# Patient Record
Sex: Female | Born: 1955 | Race: White | Hispanic: No | Marital: Single | Smoking: Never smoker
Health system: Southern US, Community
[De-identification: ages and names within clinical notes are randomized; demographics above are authoritative.]

## PROBLEM LIST (undated history)

## (undated) DIAGNOSIS — E039 Hypothyroidism, unspecified: Secondary | ICD-10-CM

## (undated) DIAGNOSIS — T7840XA Allergy, unspecified, initial encounter: Secondary | ICD-10-CM

## (undated) DIAGNOSIS — J45909 Unspecified asthma, uncomplicated: Secondary | ICD-10-CM

## (undated) DIAGNOSIS — E079 Disorder of thyroid, unspecified: Secondary | ICD-10-CM

## (undated) DIAGNOSIS — F32A Depression, unspecified: Secondary | ICD-10-CM

## (undated) DIAGNOSIS — J342 Deviated nasal septum: Secondary | ICD-10-CM

## (undated) DIAGNOSIS — I10 Essential (primary) hypertension: Secondary | ICD-10-CM

## (undated) DIAGNOSIS — M1990 Unspecified osteoarthritis, unspecified site: Secondary | ICD-10-CM

## (undated) DIAGNOSIS — F329 Major depressive disorder, single episode, unspecified: Secondary | ICD-10-CM

## (undated) HISTORY — PX: TONSILLECTOMY: SUR1361

## (undated) HISTORY — DX: Disorder of thyroid, unspecified: E07.9

## (undated) HISTORY — DX: Major depressive disorder, single episode, unspecified: F32.9

## (undated) HISTORY — DX: Depression, unspecified: F32.A

## (undated) HISTORY — DX: Allergy, unspecified, initial encounter: T78.40XA

## (undated) HISTORY — DX: Unspecified asthma, uncomplicated: J45.909

## (undated) HISTORY — PX: WISDOM TOOTH EXTRACTION: SHX21

## (undated) HISTORY — DX: Unspecified osteoarthritis, unspecified site: M19.90

---

## 1993-08-01 HISTORY — PX: OVARIAN CYST SURGERY: SHX726

## 2000-05-31 ENCOUNTER — Encounter: Payer: Self-pay | Admitting: Specialist

## 2000-05-31 ENCOUNTER — Encounter: Admission: RE | Admit: 2000-05-31 | Discharge: 2000-05-31 | Payer: Self-pay | Admitting: Specialist

## 2016-02-09 DIAGNOSIS — E065 Other chronic thyroiditis: Secondary | ICD-10-CM | POA: Diagnosis not present

## 2016-02-09 DIAGNOSIS — E509 Vitamin A deficiency, unspecified: Secondary | ICD-10-CM | POA: Diagnosis not present

## 2016-02-09 DIAGNOSIS — E559 Vitamin D deficiency, unspecified: Secondary | ICD-10-CM | POA: Diagnosis not present

## 2016-02-09 DIAGNOSIS — E039 Hypothyroidism, unspecified: Secondary | ICD-10-CM | POA: Diagnosis not present

## 2016-07-23 ENCOUNTER — Ambulatory Visit (INDEPENDENT_AMBULATORY_CARE_PROVIDER_SITE_OTHER): Payer: BLUE CROSS/BLUE SHIELD

## 2016-07-23 ENCOUNTER — Ambulatory Visit (INDEPENDENT_AMBULATORY_CARE_PROVIDER_SITE_OTHER): Payer: BLUE CROSS/BLUE SHIELD | Admitting: Urgent Care

## 2016-07-23 VITALS — BP 142/92 | HR 71 | Temp 98.4°F | Resp 17 | Ht 63.0 in

## 2016-07-23 DIAGNOSIS — J069 Acute upper respiratory infection, unspecified: Secondary | ICD-10-CM

## 2016-07-23 DIAGNOSIS — R05 Cough: Secondary | ICD-10-CM | POA: Diagnosis not present

## 2016-07-23 DIAGNOSIS — B9789 Other viral agents as the cause of diseases classified elsewhere: Secondary | ICD-10-CM

## 2016-07-23 DIAGNOSIS — J029 Acute pharyngitis, unspecified: Secondary | ICD-10-CM | POA: Diagnosis not present

## 2016-07-23 LAB — POCT CBC
GRANULOCYTE PERCENT: 58.7 % (ref 37–80)
HCT, POC: 44.8 % (ref 37.7–47.9)
Hemoglobin: 15.9 g/dL (ref 12.2–16.2)
Lymph, poc: 2.9 (ref 0.6–3.4)
MCH: 32.3 pg — AB (ref 27–31.2)
MCHC: 35.5 g/dL — AB (ref 31.8–35.4)
MCV: 91 fL (ref 80–97)
MID (CBC): 0.7 (ref 0–0.9)
MPV: 6.9 fL (ref 0–99.8)
POC Granulocyte: 5 (ref 2–6.9)
POC LYMPH PERCENT: 33.5 %L (ref 10–50)
POC MID %: 7.8 % (ref 0–12)
Platelet Count, POC: 289 10*3/uL (ref 142–424)
RBC: 4.93 M/uL (ref 4.04–5.48)
RDW, POC: 13.1 %
WBC: 8.6 10*3/uL (ref 4.6–10.2)

## 2016-07-23 LAB — POCT RAPID STREP A (OFFICE): Rapid Strep A Screen: NEGATIVE

## 2016-07-23 MED ORDER — PSEUDOEPHEDRINE HCL 60 MG PO TABS: 60.0000 mg | ORAL_TABLET | Freq: Three times a day (TID) | ORAL | 0 refills | Status: DC | PRN

## 2016-07-23 MED ORDER — ACETAMINOPHEN-CODEINE 120-12 MG/5ML PO SOLN: 10.0000 mL | Freq: Four times a day (QID) | ORAL | 0 refills | Status: DC | PRN

## 2016-07-23 NOTE — Patient Instructions (Addendum)
Upper Respiratory Infection, Adult Most upper respiratory infections (URIs) are a viral infection of the air passages leading to the lungs. A URI affects the nose, throat, and upper air passages. The most common type of URI is nasopharyngitis and is typically referred to as "the common cold." URIs run their course and usually go away on their own. Most of the time, a URI does not require medical attention, but sometimes a bacterial infection in the upper airways can follow a viral infection. This is called a secondary infection. Sinus and middle ear infections are common types of secondary upper respiratory infections. Bacterial pneumonia can also complicate a URI. A URI can worsen asthma and chronic obstructive pulmonary disease (COPD). Sometimes, these complications can require emergency medical care and may be life threatening. What are the causes? Almost all URIs are caused by viruses. A virus is a type of germ and can spread from one person to another. What increases the risk? You may be at risk for a URI if:  You smoke.  You have chronic heart or lung disease.  You have a weakened defense (immune) system.  You are very young or very old.  You have nasal allergies or asthma.  You work in crowded or poorly ventilated areas.  You work in health care facilities or schools.  What are the signs or symptoms? Symptoms typically develop 2-3 days after you come in contact with a cold virus. Most viral URIs last 7-10 days. However, viral URIs from the influenza virus (flu virus) can last 14-18 days and are typically more severe. Symptoms may include:  Runny or stuffy (congested) nose.  Sneezing.  Cough.  Sore throat.  Headache.  Fatigue.  Fever.  Loss of appetite.  Pain in your forehead, behind your eyes, and over your cheekbones (sinus pain).  Muscle aches.  How is this diagnosed? Your health care provider may diagnose a URI by:  Physical exam.  Tests to check that your  symptoms are not due to another condition such as: ? Strep throat. ? Sinusitis. ? Pneumonia. ? Asthma.  How is this treated? A URI goes away on its own with time. It cannot be cured with medicines, but medicines may be prescribed or recommended to relieve symptoms. Medicines may help:  Reduce your fever.  Reduce your cough.  Relieve nasal congestion.  Follow these instructions at home:  Take medicines only as directed by your health care provider.  Gargle warm saltwater or take cough drops to comfort your throat as directed by your health care provider.  Use a warm mist humidifier or inhale steam from a shower to increase air moisture. This may make it easier to breathe.  Drink enough fluid to keep your urine clear or pale yellow.  Eat soups and other clear broths and maintain good nutrition.  Rest as needed.  Return to work when your temperature has returned to normal or as your health care provider advises. You may need to stay home longer to avoid infecting others. You can also use a face mask and careful hand washing to prevent spread of the virus.  Increase the usage of your inhaler if you have asthma.  Do not use any tobacco products, including cigarettes, chewing tobacco, or electronic cigarettes. If you need help quitting, ask your health care provider. How is this prevented? The best way to protect yourself from getting a cold is to practice good hygiene.  Avoid oral or hand contact with people with cold symptoms.  Wash your   hands often if contact occurs.  There is no clear evidence that vitamin C, vitamin E, echinacea, or exercise reduces the chance of developing a cold. However, it is always recommended to get plenty of rest, exercise, and practice good nutrition. Contact a health care provider if:  You are getting worse rather than better.  Your symptoms are not controlled by medicine.  You have chills.  You have worsening shortness of breath.  You have  brown or red mucus.  You have yellow or brown nasal discharge.  You have pain in your face, especially when you bend forward.  You have a fever.  You have swollen neck glands.  You have pain while swallowing.  You have white areas in the back of your throat. Get help right away if:  You have severe or persistent: ? Headache. ? Ear pain. ? Sinus pain. ? Chest pain.  You have chronic lung disease and any of the following: ? Wheezing. ? Prolonged cough. ? Coughing up blood. ? A change in your usual mucus.  You have a stiff neck.  You have changes in your: ? Vision. ? Hearing. ? Thinking. ? Mood. This information is not intended to replace advice given to you by your health care provider. Make sure you discuss any questions you have with your health care provider. Document Released: 01/11/2001 Document Revised: 03/20/2016 Document Reviewed: 10/23/2013 Elsevier Interactive Patient Education  2017 Elsevier Inc.     IF you received an x-ray today, you will receive an invoice from [facility]. Please contact [facility] at [phone] with questions or concerns regarding your invoice.   IF you received labwork today, you will receive an invoice from [facility]. Please contact [facility] at [phone] with questions or concerns regarding your invoice.   Our billing staff will not be able to assist you with questions regarding bills from these companies.  You will be contacted with the lab results as soon as they are available. The fastest way to get your results is to activate your My Chart account. Instructions are located on the last page of this paperwork. If you have not heard from us regarding the results in 2 weeks, please contact this office.     

## 2016-07-23 NOTE — Progress Notes (Addendum)
MRN: [id number] DOB: 01-25-1956  Subjective:   Brenda Christian is a 60 y.o. female presenting for chief complaint of Sore Throat ("white specks on tonsil". PT REFUSED WEIGHT AT TRIAGE)  Reports 2 day history of sore throat, white spots on her tonsils. Also has sinus headache, sinus pressure, lymph node pain, productive cough (worse in the morning). She had cold like symptoms in mid-November for 2 weeks. Has tried multiple otc cough medications, decongestants. Denies fever, sinus pain, ear pain, chest pain, shob, n/v, abdominal pain, rashes. Denies history of asthma. But does get wheezing around cats. Admits environmental allergies. Denies smoking cigarettes.  Brenda Christian has a current medication list which includes the following prescription(s): cholecalciferol, glucosamine sulfate, ibuprofen, iodine-vitamin a, levothyroxine, liothyronine, losartan, fish oil, and vitamin a. Also is allergic to penicillins.  Brenda Christian has pmh of hypothyroidism. Denies past surgical history.  Denies family history of lung disease, lung cancer.  Objective:   Vitals: BP (!) 142/92 (BP Location: Left Arm, Patient Position: Sitting, Cuff Size: Large)   Pulse 71   Temp 98.4 F (36.9 C) (Oral)   Resp 17   Ht 5\' 3"  (1.6 m)   SpO2 98%   Physical Exam  Constitutional: She is oriented to person, place, and time. She appears well-developed and well-nourished.  HENT:  TM's intact bilaterally, no effusions or erythema. Nasal turbinates pink and dry, nasal passages minimally patent. No sinus tenderness. Oropharynx with mild-moderate post-nasal drainage, mucous membranes moist, dentition in good repair.  Eyes: Right eye exhibits no discharge. Left eye exhibits no discharge. No scleral icterus.  Neck: Normal range of motion. Neck supple.  Cardiovascular: Normal rate, regular rhythm and intact distal pulses.  Exam reveals no gallop and no friction rub.   No murmur heard. Pulmonary/Chest: No respiratory distress. She  has no wheezes. She has no rales.  Lymphadenopathy:    She has cervical adenopathy (R>L).  Neurological: She is alert and oriented to person, place, and time.  Skin: Skin is warm and dry.   Results for orders placed or performed in visit on 07/23/16 (from the past 24 hour(s))  POCT CBC     Status: Abnormal   Collection Time: 07/23/16 11:17 AM  Result Value Ref Range   WBC 8.6 4.6 - 10.2 K/uL   Lymph, poc 2.9 0.6 - 3.4   POC LYMPH PERCENT 33.5 10 - 50 %L   MID (cbc) 0.7 0 - 0.9   POC MID % 7.8 0 - 12 %M   POC Granulocyte 5.0 2 - 6.9   Granulocyte percent 58.7 37 - 80 %G   RBC 4.93 4.04 - 5.48 M/uL   Hemoglobin 15.9 12.2 - 16.2 g/dL   HCT, POC 40.944.8 81.137.7 - 47.9 %   MCV 91.0 80 - 97 fL   MCH, POC 32.3 (A) 27 - 31.2 pg   MCHC 35.5 (A) 31.8 - 35.4 g/dL   RDW, POC 91.413.1 %   Platelet Count, POC 289 142 - 424 K/uL   MPV 6.9 0 - 99.8 fL  POCT rapid strep A     Status: Normal   Collection Time: 07/23/16 11:44 AM  Result Value Ref Range   Rapid Strep A Screen Negative Negative   Dg Chest 2 View  Result Date: 07/23/2016 CLINICAL DATA:  Cough since November. EXAM: CHEST  2 VIEW COMPARISON:  None. FINDINGS: Lungs are adequately inflated without consolidation or effusion. Cardiomediastinal silhouette is within normal. There mild degenerate changes of the spine. IMPRESSION: No active cardiopulmonary disease.  Electronically Signed   By: Elberta Fortisaniel  Boyle M.D.   On: 07/23/2016 11:42   Assessment and Plan :   1. Viral URI with cough 2. Sore throat - Likely viral in nature, advised supportive care. - If no improvement or symptoms do not resolve return to clinic in 3 days.  Wallis BambergMario Kalden Wanke, PA-C [facility] [phone] 07/23/2016 10:34 AM

## 2016-07-26 ENCOUNTER — Encounter: Payer: Self-pay | Admitting: Urgent Care

## 2016-07-28 LAB — CULTURE, GROUP A STREP: Strep A Culture: NEGATIVE

## 2017-04-14 ENCOUNTER — Ambulatory Visit: Payer: BLUE CROSS/BLUE SHIELD | Admitting: Family Medicine

## 2017-04-17 NOTE — Progress Notes (Signed)
Subjective:    Patient ID: Brenda Christian, female    DOB: 03-Jun-1956, 61 y.o.   MRN: [id number]  04/18/2017  Establish Care; Hypothyroidism; and Osteoarthritis   HPI This 61 y.o. female presents to establish care.   Last physical:  01/2016 Pap smear:  Not sexually active; several years ago; before 61 years old. Mammogram:  Never; REFUSES Colonoscopy:  Never; REFUSES. Bone density:  never Eye exam:  4 years; +glasses; +readers Dental exam:  Several years  [facility]/PCP/Integrative Medicine; recommended establishing with PC.  Hypothyroidism: treating thyroid; integrative medicine.  +antibodies.  Doing very well right now.  Obesity: stable.   B knees osteoarthritis: Flexogenic; tai chi has really helped with balance a lot. Not a swimmer, so recommended Tai Chi.  Loves physical therapy.  Seated stepper which was great.    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   White coat syndrome: ran the average of BPs. Taking Losartan '25mg'$  bid.  Panic attacks: ginseng green tea instead of crystal light.    REFUSING MAMMOGRAPHY AND COLONOSCOPY.  Does not really want to do colonoscopy.  Cost is a huge issue.   Has a very high deductible.    BP Readings from Last 3 Encounters:  04/18/17 (!) 149/83  07/23/16 (!) 142/92   Wt Readings from Last 3 Encounters:  No data found for Wt    There is no immunization history on file for this patient.  Review of Systems  Constitutional: Negative for activity change, appetite change, chills, diaphoresis, fatigue, fever and unexpected weight change.  HENT: Negative for congestion, dental problem, drooling, ear discharge, ear pain, facial swelling, hearing loss, mouth sores, nosebleeds, postnasal drip, rhinorrhea, sinus pressure, sneezing, sore throat, tinnitus, trouble swallowing and voice change.   Eyes: Negative for photophobia, pain, discharge, redness, itching and visual disturbance.  Respiratory:  Negative for apnea, cough, choking, chest tightness, shortness of breath, wheezing and stridor.   Cardiovascular: Negative for chest pain, palpitations and leg swelling.  Gastrointestinal: Negative for abdominal distention, abdominal pain, anal bleeding, blood in stool, constipation, diarrhea, nausea, rectal pain and vomiting.  Endocrine: Negative for cold intolerance, heat intolerance, polydipsia, polyphagia and polyuria.  Genitourinary: Negative for decreased urine volume, difficulty urinating, dyspareunia, dysuria, enuresis, flank pain, frequency, genital sores, hematuria, menstrual problem, pelvic pain, urgency, vaginal bleeding, vaginal discharge and vaginal pain.       Nocturia x 3.  urianry leakage intermittently.  Musculoskeletal: Positive for arthralgias. Negative for back pain, gait problem, joint swelling, myalgias, neck pain and neck stiffness.  Skin: Negative for color change, pallor, rash and wound.  Allergic/Immunologic: Negative for environmental allergies, food allergies and immunocompromised state.  Neurological: Negative for dizziness, tremors, seizures, syncope, facial asymmetry, speech difficulty, weakness, light-headedness, numbness and headaches.  Hematological: Negative for adenopathy. Does not bruise/bleed easily.  Psychiatric/Behavioral: Negative for agitation, behavioral problems, confusion, decreased concentration, dysphoric mood, hallucinations, self-injury, sleep disturbance and suicidal ideas. The patient is nervous/anxious. The patient is not hyperactive.        Bedtime 1100; wakes up 7-8 hours later.    Past Medical History:  Diagnosis Date  . Allergy    cats  . Arthritis    knees  . Asthma   . Depression   . Thyroid disease    Past Surgical History:  Procedure Laterality Date  . OVARIAN CYST SURGERY    . TONSILLECTOMY    . WISDOM TOOTH EXTRACTION     Allergies  Allergen Reactions  . Eggs Or Egg-Derived Products   .  Penicillins    Current  Outpatient Prescriptions  Medication Sig Dispense Refill  . cholecalciferol (VITAMIN D) 400 units TABS tablet Take 10,000 Units by mouth daily.     . Glucosamine Sulfate 1000 MG CAPS Take by mouth.    . Ibuprofen 200 MG CAPS Take 200 mg by mouth at bedtime as needed for pain.     Nani Ravens A PO Take by mouth.    . levothyroxine (SYNTHROID, LEVOTHROID) 100 MCG tablet Take 1 tablet (100 mcg total) by mouth daily before breakfast. 90 tablet 3  . liothyronine (CYTOMEL) 25 MCG tablet Take 1 tablet (25 mcg total) by mouth daily. 90 tablet 3  . losartan (COZAAR) 25 MG tablet Take 1 tablet (25 mg total) by mouth 2 (two) times daily. 180 tablet 1  . Omega-3 Fatty Acids (FISH OIL) 1000 MG CAPS Take 1 capsule by mouth 2 (two) times daily.      No current facility-administered medications for this visit.    Social History   Social History  . Marital status: Single    Spouse name: N/A  . Number of children: N/A  . Years of education: N/A   Occupational History  . Not on file.   Social History Main Topics  . Smoking status: Never Smoker  . Smokeless tobacco: Never Used  . Alcohol use Yes  . Drug use: No  . Sexual activity: No   Other Topics Concern  . 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       Tobacco: none      Alcohol:  Rarely      Drugs; none      Exercise:  Tai Chi two days per week on Mondays and Wednesdays   Family History  Problem Relation Age of Onset  . Heart disease Mother   . Diabetes Mother   . Stroke Mother 75       CVA x 2  . Hyperlipidemia Father   . Stroke Maternal Grandmother   . Mental retardation Maternal Grandmother   . Heart disease Maternal Grandfather   . Cancer Paternal Grandmother   . Cancer Paternal Grandfather   . Heart disease Paternal Grandfather        Objective:    BP (!) 149/83    Pulse 81   Temp 98 F (36.7 C) (Oral)   Resp 16   Ht '5\' 2"'$  (1.575 m)   SpO2 96%  Physical Exam  Constitutional: She is oriented to person, place, and time. She appears well-developed and well-nourished. No distress.  HENT:  Head: Normocephalic and atraumatic.  Right Ear: External ear normal.  Left Ear: External ear normal.  Nose: Nose normal.  Mouth/Throat: Oropharynx is clear and moist.  Eyes: Pupils are equal, round, and reactive to light. Conjunctivae and EOM are normal.  Neck: Normal range of motion. Neck supple. Carotid bruit is not present. No thyromegaly present.  Cardiovascular: Normal rate, regular rhythm, normal heart sounds and intact distal pulses.  Exam reveals no gallop and no friction rub.   No murmur heard. Pulmonary/Chest: Effort normal and breath sounds normal. She has no wheezes. She has no rales.  Abdominal: Soft. Bowel sounds are normal. She exhibits no distension and no mass. There is no tenderness. There is no rebound and  no guarding.  Lymphadenopathy:    She has no cervical adenopathy.  Neurological: She is alert and oriented to person, place, and time. No cranial nerve deficit.  Skin: Skin is warm and dry. No rash noted. She is not diaphoretic. No erythema. No pallor.  Psychiatric: She has a normal mood and affect. Her behavior is normal.    No results found. Depression screen [facility] [address] 2/9 04/18/2017 07/23/2016  Decreased Interest 0 0  Down, Depressed, Hopeless 0 0  PHQ - 2 Score 0 0   Fall Risk  04/18/2017 07/23/2016  Falls in the past year? No No        Assessment & Plan:   1. Routine physical examination   2. Essential hypertension   3. Hashimoto's thyroiditis   4. Vitamin D deficiency   5. Colon cancer screening   6. Screening, lipid   7. Screening for diabetes mellitus   8. Primary osteoarthritis of both knees   9. Class 3 severe obesity due to excess calories without serious comorbidity with body mass index (BMI) of 40.0 to 44.9 in adult [facility])     -anticipatory guidance provided --- exercise, weight loss, safe driving practices, aspirin '81mg'$  daily. -obtain age appropriate screening labs and labs for chronic disease management. -stable chronic medical conditions; obtain labs; refills provided.  -pt refuses colonoscopy due to expense yet agreeable to hemosure completion; kit provided. -pt refuses pap smear and mammogram at this time; guidelines reviewed with patient. -pt refused immunizations yet current guidelines reviewed in detail.    Orders Placed This Encounter  Procedures  . CBC with Differential/Platelet  . Comprehensive metabolic panel    Order Specific Question:   Has the patient fasted?    Answer:   Yes  . Hemoglobin A1c  . Lipid panel    Order Specific Question:   Has the patient fasted?    Answer:   Yes  . T4, free  . TSH  . VITAMIN D 25 Hydroxy (Vit-D Deficiency, Fractures)  . IFOBT POC (occult bld, rslt in office)    Standing Status:   Future    Standing Expiration Date:   04/18/2018  . POCT urinalysis dipstick   Meds ordered this encounter  Medications  . levothyroxine (SYNTHROID, LEVOTHROID) 100 MCG tablet    Sig: Take 1 tablet (100 mcg total) by mouth daily before breakfast.    Dispense:  90 tablet    Refill:  3  . liothyronine (CYTOMEL) 25 MCG tablet    Sig: Take 1 tablet (25 mcg total) by mouth daily.    Dispense:  90 tablet    Refill:  3  . losartan (COZAAR) 25 MG tablet    Sig: Take 1 tablet (25 mg total) by mouth 2 (two) times daily.    Dispense:  180 tablet    Refill:  1    Return in about 6 months (around 10/16/2017) for recheck thyroid, kidney function.   Kristi Elayne Guerin, M.D. Primary Care at [facility] previously [facility] [place] [address] [place], [place]  [id number] [phone] phone [phone] fax

## 2017-04-18 ENCOUNTER — Ambulatory Visit (INDEPENDENT_AMBULATORY_CARE_PROVIDER_SITE_OTHER): Payer: BLUE CROSS/BLUE SHIELD | Admitting: Family Medicine

## 2017-04-18 ENCOUNTER — Encounter: Payer: Self-pay | Admitting: Family Medicine

## 2017-04-18 VITALS — BP 149/83 | HR 81 | Temp 98.0°F | Resp 16 | Ht 62.0 in

## 2017-04-18 DIAGNOSIS — M170 Bilateral primary osteoarthritis of knee: Secondary | ICD-10-CM

## 2017-04-18 DIAGNOSIS — E559 Vitamin D deficiency, unspecified: Secondary | ICD-10-CM

## 2017-04-18 DIAGNOSIS — Z0000 Encounter for general adult medical examination without abnormal findings: Secondary | ICD-10-CM | POA: Diagnosis not present

## 2017-04-18 DIAGNOSIS — E063 Autoimmune thyroiditis: Secondary | ICD-10-CM | POA: Diagnosis not present

## 2017-04-18 DIAGNOSIS — I10 Essential (primary) hypertension: Secondary | ICD-10-CM

## 2017-04-18 DIAGNOSIS — Z131 Encounter for screening for diabetes mellitus: Secondary | ICD-10-CM | POA: Diagnosis not present

## 2017-04-18 DIAGNOSIS — Z1211 Encounter for screening for malignant neoplasm of colon: Secondary | ICD-10-CM | POA: Diagnosis not present

## 2017-04-18 DIAGNOSIS — Z13220 Encounter for screening for lipoid disorders: Secondary | ICD-10-CM | POA: Diagnosis not present

## 2017-04-18 DIAGNOSIS — Z6841 Body Mass Index (BMI) 40.0 and over, adult: Secondary | ICD-10-CM

## 2017-04-18 LAB — POCT URINALYSIS DIP (MANUAL ENTRY)
BILIRUBIN UA: NEGATIVE
BILIRUBIN UA: NEGATIVE mg/dL
Glucose, UA: NEGATIVE mg/dL
NITRITE UA: NEGATIVE
PH UA: 5.5 (ref 5.0–8.0)
PROTEIN UA: NEGATIVE mg/dL
RBC UA: NEGATIVE
Spec Grav, UA: 1.02 (ref 1.010–1.025)
Urobilinogen, UA: 0.2 E.U./dL

## 2017-04-18 MED ORDER — LEVOTHYROXINE SODIUM 100 MCG PO TABS: 100.0000 ug | ORAL_TABLET | Freq: Every day | ORAL | 3 refills | Status: DC

## 2017-04-18 MED ORDER — LOSARTAN POTASSIUM 25 MG PO TABS: 25.0000 mg | ORAL_TABLET | Freq: Two times a day (BID) | ORAL | 1 refills | Status: DC

## 2017-04-18 MED ORDER — LIOTHYRONINE SODIUM 25 MCG PO TABS: 25.0000 ug | ORAL_TABLET | Freq: Every day | ORAL | 3 refills | Status: DC

## 2017-04-18 NOTE — Patient Instructions (Addendum)
IF you received an x-ray today, you will receive an invoice from [facility] Radiology. Please contact [facility] at [phone] with questions or concerns regarding your invoice.   IF you received labwork today, you will receive an invoice from [facility]. Please contact [facility] at [phone] with questions or concerns regarding your invoice.   Our billing staff will not be able to assist you with questions regarding bills from these companies.  You will be contacted with the lab results as soon as they are available. The fastest way to get your results is to activate your My Chart account. Instructions are located on the last page of this paperwork. If you have not heard from [place] regarding the results in 2 weeks, please contact this office.      Preventive Care 40-64 Years, Female Preventive care refers to lifestyle choices and visits with your health care provider that can promote health and wellness. What does preventive care include?  A yearly physical exam. This is also called an annual well check.  Dental exams once or twice a year.  Routine eye exams. Ask your health care provider how often you should have your eyes checked.  Personal lifestyle choices, including: ? Daily care of your teeth and gums. ? Regular physical activity. ? Eating a healthy diet. ? Avoiding tobacco and drug use. ? Limiting alcohol use. ? Practicing safe sex. ? Taking low-dose aspirin daily starting at age 34. ? Taking vitamin and mineral supplements as recommended by your health care provider. What happens during an annual well check? The services and screenings done by your health care provider during your annual well check will depend on your age, overall health, lifestyle risk factors, and family history of disease. Counseling Your health care provider may ask you questions about your:  Alcohol use.  Tobacco use.  Drug use.  Emotional well-being.  Home and relationship  well-being.  Sexual activity.  Eating habits.  Work and work Statistician.  Method of birth control.  Menstrual cycle.  Pregnancy history.  Screening You may have the following tests or measurements:  Height, weight, and BMI.  Blood pressure.  Lipid and cholesterol levels. These may be checked every 5 years, or more frequently if you are over 65 years old.  Skin check.  Lung cancer screening. You may have this screening every year starting at age 102 if you have a 30-pack-year history of smoking and currently smoke or have quit within the past 15 years.  Fecal occult blood test (FOBT) of the stool. You may have this test every year starting at age 33.  Flexible sigmoidoscopy or colonoscopy. You may have a sigmoidoscopy every 5 years or a colonoscopy every 10 years starting at age 23.  Hepatitis C blood test.  Hepatitis B blood test.  Sexually transmitted disease (STD) testing.  Diabetes screening. This is done by checking your blood sugar (glucose) after you have not eaten for a while (fasting). You may have this done every 1-3 years.  Mammogram. This may be done every 1-2 years. Talk to your health care provider about when you should start having regular mammograms. This may depend on whether you have a family history of breast cancer.  BRCA-related cancer screening. This may be done if you have a family history of breast, ovarian, tubal, or peritoneal cancers.  Pelvic exam and Pap test. This may be done every 3 years starting at age 3. Starting at age 55, this may be done every 5 years if  you have a Pap test in combination with an HPV test.  Bone density scan. This is done to screen for osteoporosis. You may have this scan if you are at high risk for osteoporosis.  Discuss your test results, treatment options, and if necessary, the need for more tests with your health care provider. Vaccines Your health care provider may recommend certain vaccines, such  as:  Influenza vaccine. This is recommended every year.  Tetanus, diphtheria, and acellular pertussis (Tdap, Td) vaccine. You may need a Td booster every 10 years.  Varicella vaccine. You may need this if you have not been vaccinated.  Zoster vaccine. You may need this after age 70.  Measles, mumps, and rubella (MMR) vaccine. You may need at least one dose of MMR if you were born in 1957 or later. You may also need a second dose.  Pneumococcal 13-valent conjugate (PCV13) vaccine. You may need this if you have certain conditions and were not previously vaccinated.  Pneumococcal polysaccharide (PPSV23) vaccine. You may need one or two doses if you smoke cigarettes or if you have certain conditions.  Meningococcal vaccine. You may need this if you have certain conditions.  Hepatitis A vaccine. You may need this if you have certain conditions or if you travel or work in places where you may be exposed to hepatitis A.  Hepatitis B vaccine. You may need this if you have certain conditions or if you travel or work in places where you may be exposed to hepatitis B.  Haemophilus influenzae type b (Hib) vaccine. You may need this if you have certain conditions.  Talk to your health care provider about which screenings and vaccines you need and how often you need them. This information is not intended to replace advice given to you by your health care provider. Make sure you discuss any questions you have with your health care provider. Document Released: 08/14/2015 Document Revised: 04/06/2016 Document Reviewed: 05/19/2015 Elsevier Interactive Patient Education  2017 [employer].

## 2017-04-19 LAB — CBC WITH DIFFERENTIAL/PLATELET
BASOS: 1 %
Basophils Absolute: 0 10*3/uL (ref 0.0–0.2)
EOS (ABSOLUTE): 0.1 10*3/uL (ref 0.0–0.4)
Eos: 1 %
Hematocrit: 45.2 % (ref 34.0–46.6)
Hemoglobin: 15.2 g/dL (ref 11.1–15.9)
IMMATURE GRANULOCYTES: 0 %
Immature Grans (Abs): 0 10*3/uL (ref 0.0–0.1)
Lymphocytes Absolute: 3.1 10*3/uL (ref 0.7–3.1)
Lymphs: 43 %
MCH: 31.3 pg (ref 26.6–33.0)
MCHC: 33.6 g/dL (ref 31.5–35.7)
MCV: 93 fL (ref 79–97)
Monocytes Absolute: 0.5 10*3/uL (ref 0.1–0.9)
Monocytes: 7 %
NEUTROS ABS: 3.4 10*3/uL (ref 1.4–7.0)
NEUTROS PCT: 48 %
Platelets: 282 10*3/uL (ref 150–379)
RBC: 4.86 x10E6/uL (ref 3.77–5.28)
RDW: 14.1 % (ref 12.3–15.4)
WBC: 7.2 10*3/uL (ref 3.4–10.8)

## 2017-04-19 LAB — COMPREHENSIVE METABOLIC PANEL
ALK PHOS: 71 IU/L (ref 39–117)
ALT: 20 IU/L (ref 0–32)
AST: 19 IU/L (ref 0–40)
Albumin/Globulin Ratio: 1.6 (ref 1.2–2.2)
Albumin: 4.5 g/dL (ref 3.6–4.8)
BILIRUBIN TOTAL: 0.4 mg/dL (ref 0.0–1.2)
BUN/Creatinine Ratio: 21 (ref 12–28)
BUN: 15 mg/dL (ref 8–27)
CHLORIDE: 102 mmol/L (ref 96–106)
CO2: 23 mmol/L (ref 20–29)
Calcium: 9.8 mg/dL (ref 8.7–10.3)
Creatinine, Ser: 0.72 mg/dL (ref 0.57–1.00)
GFR calc non Af Amer: 90 mL/min/{1.73_m2} (ref >59)
GFR, EST AFRICAN AMERICAN: 104 mL/min/{1.73_m2} (ref >59)
GLOBULIN, TOTAL: 2.8 g/dL (ref 1.5–4.5)
Glucose: 100 mg/dL — ABNORMAL HIGH (ref 65–99)
Potassium: 5.3 mmol/L — ABNORMAL HIGH (ref 3.5–5.2)
SODIUM: 143 mmol/L (ref 134–144)
TOTAL PROTEIN: 7.3 g/dL (ref 6.0–8.5)

## 2017-04-19 LAB — HEMOGLOBIN A1C
ESTIMATED AVERAGE GLUCOSE: 105 mg/dL
Hgb A1c MFr Bld: 5.3 % (ref 4.8–5.6)

## 2017-04-19 LAB — LIPID PANEL
Chol/HDL Ratio: 2.6 ratio (ref 0.0–4.4)
Cholesterol, Total: 217 mg/dL — ABNORMAL HIGH (ref 100–199)
HDL: 83 mg/dL (ref >39)
LDL Calculated: 111 mg/dL — ABNORMAL HIGH (ref 0–99)
Triglycerides: 114 mg/dL (ref 0–149)
VLDL CHOLESTEROL CAL: 23 mg/dL (ref 5–40)

## 2017-04-19 LAB — T4, FREE: FREE T4: 1.18 ng/dL (ref 0.82–1.77)

## 2017-04-19 LAB — TSH: TSH: 0.058 u[IU]/mL — AB (ref 0.450–4.500)

## 2017-04-19 LAB — VITAMIN D 25 HYDROXY (VIT D DEFICIENCY, FRACTURES): VIT D 25 HYDROXY: 59.9 ng/mL (ref 30.0–100.0)

## 2017-04-21 DIAGNOSIS — E063 Autoimmune thyroiditis: Secondary | ICD-10-CM | POA: Insufficient documentation

## 2017-04-21 DIAGNOSIS — I10 Essential (primary) hypertension: Secondary | ICD-10-CM | POA: Insufficient documentation

## 2017-04-21 DIAGNOSIS — Z6841 Body Mass Index (BMI) 40.0 and over, adult: Secondary | ICD-10-CM | POA: Insufficient documentation

## 2017-04-21 DIAGNOSIS — M170 Bilateral primary osteoarthritis of knee: Secondary | ICD-10-CM | POA: Insufficient documentation

## 2017-05-02 ENCOUNTER — Emergency Department (HOSPITAL_COMMUNITY): Payer: BLUE CROSS/BLUE SHIELD

## 2017-05-02 ENCOUNTER — Encounter: Payer: Self-pay | Admitting: Urgent Care

## 2017-05-02 ENCOUNTER — Encounter (HOSPITAL_COMMUNITY): Payer: Self-pay | Admitting: Emergency Medicine

## 2017-05-02 ENCOUNTER — Ambulatory Visit (INDEPENDENT_AMBULATORY_CARE_PROVIDER_SITE_OTHER): Payer: BLUE CROSS/BLUE SHIELD | Admitting: Urgent Care

## 2017-05-02 VITALS — BP 160/86 | HR 86 | Temp 99.1°F | Resp 18 | Ht 62.0 in

## 2017-05-02 DIAGNOSIS — I4892 Unspecified atrial flutter: Secondary | ICD-10-CM | POA: Diagnosis not present

## 2017-05-02 DIAGNOSIS — R030 Elevated blood-pressure reading, without diagnosis of hypertension: Secondary | ICD-10-CM | POA: Diagnosis not present

## 2017-05-02 DIAGNOSIS — I10 Essential (primary) hypertension: Secondary | ICD-10-CM | POA: Insufficient documentation

## 2017-05-02 DIAGNOSIS — R000 Tachycardia, unspecified: Secondary | ICD-10-CM | POA: Diagnosis not present

## 2017-05-02 DIAGNOSIS — Z79899 Other long term (current) drug therapy: Secondary | ICD-10-CM | POA: Insufficient documentation

## 2017-05-02 DIAGNOSIS — Z8249 Family history of ischemic heart disease and other diseases of the circulatory system: Secondary | ICD-10-CM | POA: Diagnosis not present

## 2017-05-02 DIAGNOSIS — Z6841 Body Mass Index (BMI) 40.0 and over, adult: Secondary | ICD-10-CM | POA: Diagnosis not present

## 2017-05-02 DIAGNOSIS — J45909 Unspecified asthma, uncomplicated: Secondary | ICD-10-CM | POA: Diagnosis not present

## 2017-05-02 DIAGNOSIS — I471 Supraventricular tachycardia: Secondary | ICD-10-CM | POA: Insufficient documentation

## 2017-05-02 DIAGNOSIS — R002 Palpitations: Secondary | ICD-10-CM | POA: Diagnosis not present

## 2017-05-02 DIAGNOSIS — R509 Fever, unspecified: Secondary | ICD-10-CM | POA: Diagnosis not present

## 2017-05-02 LAB — COMPREHENSIVE METABOLIC PANEL
ALK PHOS: 68 U/L (ref 38–126)
ALT: 23 U/L (ref 14–54)
ANION GAP: 9 (ref 5–15)
AST: 26 U/L (ref 15–41)
Albumin: 3.9 g/dL (ref 3.5–5.0)
BILIRUBIN TOTAL: 0.9 mg/dL (ref 0.3–1.2)
BUN: 14 mg/dL (ref 6–20)
CALCIUM: 9.1 mg/dL (ref 8.9–10.3)
CO2: 20 mmol/L — ABNORMAL LOW (ref 22–32)
Chloride: 106 mmol/L (ref 101–111)
Creatinine, Ser: 0.77 mg/dL (ref 0.44–1.00)
GFR calc Af Amer: >60 mL/min (ref >60)
GLUCOSE: 118 mg/dL — AB (ref 65–99)
POTASSIUM: 4 mmol/L (ref 3.5–5.1)
Sodium: 135 mmol/L (ref 135–145)
TOTAL PROTEIN: 7.1 g/dL (ref 6.5–8.1)

## 2017-05-02 LAB — URINALYSIS, ROUTINE W REFLEX MICROSCOPIC
Bilirubin Urine: NEGATIVE
GLUCOSE, UA: NEGATIVE mg/dL
HGB URINE DIPSTICK: NEGATIVE
KETONES UR: NEGATIVE mg/dL
LEUKOCYTES UA: NEGATIVE
Nitrite: NEGATIVE
PROTEIN: NEGATIVE mg/dL
Specific Gravity, Urine: 1.01 (ref 1.005–1.030)
pH: 5 (ref 5.0–8.0)

## 2017-05-02 LAB — CBC WITH DIFFERENTIAL/PLATELET
Basophils Absolute: 0 10*3/uL (ref 0.0–0.1)
Basophils Relative: 0 %
Eosinophils Absolute: 0.1 10*3/uL (ref 0.0–0.7)
Eosinophils Relative: 1 %
HEMATOCRIT: 44.3 % (ref 36.0–46.0)
HEMOGLOBIN: 15.8 g/dL — AB (ref 12.0–15.0)
LYMPHS ABS: 3.2 10*3/uL (ref 0.7–4.0)
LYMPHS PCT: 36 %
MCH: 31.8 pg (ref 26.0–34.0)
MCHC: 35.7 g/dL (ref 30.0–36.0)
MCV: 89.1 fL (ref 78.0–100.0)
MONO ABS: 0.6 10*3/uL (ref 0.1–1.0)
Monocytes Relative: 7 %
NEUTROS ABS: 4.9 10*3/uL (ref 1.7–7.7)
Neutrophils Relative %: 56 %
Platelets: 283 10*3/uL (ref 150–400)
RBC: 4.97 MIL/uL (ref 3.87–5.11)
RDW: 13.3 % (ref 11.5–15.5)
WBC: 8.8 10*3/uL (ref 4.0–10.5)

## 2017-05-02 LAB — I-STAT TROPONIN, ED: Troponin i, poc: 0.04 ng/mL (ref 0.00–0.08)

## 2017-05-02 NOTE — Patient Instructions (Addendum)
Atrial Flutter Atrial flutter is a type of abnormal heart rhythm (arrhythmia). In atrial flutter, the heartbeat is fast but regular. There are two types of atrial flutter:  Paroxysmal atrial flutter. This type starts suddenly. It usually stops on its own soon after it starts.  Permanent atrial flutter. This type does not go away.  What are the causes? This condition may be caused by:  A heart condition or problem, such as: ? A heart attack. ? Heart failure. ? A heart valve problem.  A lung problem, such as: ? A blood clot in the lungs (pulmonary embolism, or PE). ? Chronic obstructive pulmonary disease.  Poorly controlled high blood pressure (hypertension).  Hyperthyroidism.  Caffeine.  Some decongestant cold medicines.  Low levels of minerals called electrolytes in the blood.  Cocaine.  What increases the risk? This condition is more likely to develop in:  Elderly adults.  Men.  What are the signs or symptoms? Symptoms of this condition include:  A feeling that your heart is pounding or racing (palpitations).  Shortness of breath.  Chest pain.  Feeling light-headed.  Dizziness.  Fainting.  How is this diagnosed? This condition may be diagnosed with tests, including:  An electrocardiogram (ECG). This is a painless test that records electrical signals in the heart.  Holter monitoring. For this test, you wear a device that records your heartbeat for 1-2 days.  Cardiac event monitoring. For this test, you wear a device that records your heartbeat for up to 30 days.  An echocardiogram. This is a painless test that uses sound waves to make a picture of your heart.  Stress test. This test records your heartbeat while you exercise.  Blood tests.  How is this treated? This condition may be treated with:  Treatment of any underlying conditions.  Medicine to make your heart beat more slowly.  Medicine to keep the condition from coming back.  A  procedure to keep the condition under control. Some procedures to do this include: ? Cardioversion. During this procedure, medicines or an electrical shock are given to make the heart beat normally. ? Ablation. During this procedure, the heart tissue that is causing the problem is destroyed. This procedure may be done if atrial flutter lasts a long time or happens often.  Follow these instructions at home:  Take over-the-counter and prescription medicines only as told by your health care provider.  Do not take any new medicines without talking to your health care provider.  Do not use tobacco products, including cigarettes, chewing tobacco, or e-cigarettes. If you need help quitting, ask your health care provider.  Limit alcohol intake to no more than 1 drink per day for nonpregnant women and 2 drinks per day for men. One drink equals 12 oz of beer, 5 oz of wine, or 1 oz of hard liquor.  Try to reduce any stress. Stress can make your symptoms worse. Contact a health care provider if:  Your symptoms get worse. Get help right away if:  You are dizzy.  You feel like fainting or you faint.  You have shortness of breath.  You feel pain or pressure in your chest.  You suddenly feel nauseous or you suddenly vomit.  There is a sudden change in your ability to speak, eat, or move.  You are sweating a lot for no reason. This information is not intended to replace advice given to you by your health care provider. Make sure you discuss any questions you have with your health care  provider. Document Released: 12/04/2008 Document Revised: 11/25/2015 Document Reviewed: 01/30/2015 Elsevier Interactive Patient Education  2018 [employer].     IF you received an x-ray today, you will receive an invoice from [facility] Radiology. Please contact [facility]iology at [phone] with questions or concerns regarding your invoice.   IF you received labwork today, you will receive an invoice  from [facility]. Please contact [facility] at [phone] with questions or concerns regarding your invoice.   Our billing staff will not be able to assist you with questions regarding bills from these companies.  You will be contacted with the lab results as soon as they are available. The fastest way to get your results is to activate your My Chart account. Instructions are located on the last page of this paperwork. If you have not heard from [place] regarding the results in 2 weeks, please contact this office.

## 2017-05-02 NOTE — Progress Notes (Signed)
    MRN: [id number] DOB: 09-19-1955  Subjective:   Brenda Christian is a 61 y.o. female presenting for chief complaint of elevated pulse (has paperwork indicating pulse elevations on today)  Pulse has been 59-65 in September and blood pressure in 110's using wrist cuff. Patient woke up this morning and had heart racing. Has had 3-5 episodes that were transient. Denies fever, chest pain, shob, diaphoresis, n/v, abdominal pain, headache, confusion, dizziness. Denies smoking cigarettes. Patient's mother has a history of atrial fibrillation, the patient denies a personal history of arrhythmia.  Kinnedy has a current medication list which includes the following prescription(s): cholecalciferol, glucosamine sulfate, ibuprofen, iodine-vitamin a, levothyroxine, liothyronine, losartan, and fish oil. Also is allergic to eggs or egg-derived products and penicillins.  Spencer  has a past medical history of Allergy; Arthritis; Asthma; Depression; and Thyroid disease. Also  has a past surgical history that includes Ovarian cyst surgery; Wisdom tooth extraction; and Tonsillectomy.   Objective:   Vitals: BP (!) 160/86   Pulse 86   Temp 99.1 F (37.3 C) (Oral)   Resp 18   Ht  (1.575 m)   SpO2 96%   BP Readings from Last 3 Encounters:  05/02/17 (!) 160/86  04/18/17 (!) 149/83  07/23/16 (!) 142/92   Physical Exam  Constitutional: She is oriented to person, place, and time. She appears well-developed and well-nourished.  HENT:  Mouth/Throat: Oropharynx is clear and moist.  Eyes: No scleral icterus.  Cardiovascular: Normal rate, regular rhythm and intact distal pulses.  Exam reveals no gallop and no friction rub.   No murmur heard. Pulmonary/Chest: No respiratory distress. She has no wheezes. She has no rales.  Abdominal: Soft. Bowel sounds are normal. She exhibits no distension and no mass. There is no tenderness. There is no guarding.  Neurological: She is alert and oriented to person, place,  and time.  Skin: Skin is warm and dry.   ECG interpretation - Atrial flutter with ventricular rate of 167bpm, multiple PVC's as seen on rhythm strip.  Assessment and Plan :   This case was precepted with Dr. Alvy Bimler.  1. Racing heart beat 2. Essential hypertension 3. Elevated blood pressure reading 4. Class 3 severe obesity due to excess calories without serious comorbidity with body mass index (BMI) of 40.0 to 44.9 in adult [facility]) - Patient's ecg very concerning, will send via EMS to stabilize patient.   Wallis Bamberg, PA-C Primary Care at [facility] [phone] 05/02/2017  4:47 PM

## 2017-05-02 NOTE — ED Triage Notes (Signed)
Pt to ED via [facility] from [facility] with c/o rapid heart beat.  Pt st's this am she felt her heart racing and drove herself to urgent care.  Pt received 1 liter of NS and now heart rate is 84  Pt denies any chest pain

## 2017-05-03 ENCOUNTER — Emergency Department (HOSPITAL_COMMUNITY)
Admission: EM | Admit: 2017-05-03 | Discharge: 2017-05-03 | Disposition: A | Discharge status: Home / Self Care | Payer: BLUE CROSS/BLUE SHIELD | Attending: Emergency Medicine | Admitting: Emergency Medicine

## 2017-05-03 ENCOUNTER — Telehealth: Payer: Self-pay | Admitting: Family Medicine

## 2017-05-03 DIAGNOSIS — R002 Palpitations: Secondary | ICD-10-CM

## 2017-05-03 DIAGNOSIS — I4892 Unspecified atrial flutter: Secondary | ICD-10-CM

## 2017-05-03 DIAGNOSIS — I471 Supraventricular tachycardia: Secondary | ICD-10-CM

## 2017-05-03 LAB — TSH: TSH: 0.055 u[IU]/mL — AB (ref 0.350–4.500)

## 2017-05-03 LAB — D-DIMER, QUANTITATIVE (NOT AT ARMC): D DIMER QUANT: 0.32 ug{FEU}/mL (ref 0.00–0.50)

## 2017-05-03 MED ORDER — METOPROLOL SUCCINATE ER 25 MG PO TB24
25.0000 mg | ORAL_TABLET | Freq: Every day | ORAL | 0 refills | Status: DC
Start: 2017-05-03 — End: 2017-05-08

## 2017-05-03 MED ORDER — ASPIRIN EC 325 MG PO TBEC
325.0000 mg | DELAYED_RELEASE_TABLET | Freq: Every day | ORAL | 0 refills | Status: DC
Start: 2017-05-03 — End: 2024-05-01

## 2017-05-03 NOTE — Telephone Encounter (Signed)
PATIENT STATES SHE WAS IN THE OFFICE YESTERDAY (05/02/17) AND SAW MARIO MANI FOR HER HEART RACING. SHE WAS SENT DIRECTLY TO [place] BY EMS. SHE WOULD LIKE TO SPEAK WITH DR. Katrinka Blazing SINCE SHE IS HER PCP. SHE SAID DR. Katrinka Blazing SHOULD BE ABLE TO SEE ALL THE TEST THAT WERE DONE ON HER AT THE HOSPITAL. HER DIAGNOSES WAS ATRIAL FLUTTER AND SHE WAS D/C ABOUT 4:30am. THEY PRESCRIBED HER TO HAVE TOPROL XR 25 MG AND ASPIRIN 325 MG. SHE WILL FEEL BETTER IF SHE TALKS WITH DR. Katrinka Blazing FIRST BEFORE SHE STARTS THIS NEW MEDICINE. BEST PHONE [phone] (HOME) OR [phone] (CELL) MBC

## 2017-05-03 NOTE — Discharge Instructions (Signed)
We saw you in the ER for palpitations. We suspect that you might be having atrial flutter, and therefore advised that he follow up with our A. Fib clinic.  Return to the ER if the symptoms get worse. Start taking full dose aspirin every day along with the medicine prescribed.

## 2017-05-03 NOTE — ED Provider Notes (Signed)
[facility]-EMERGENCY DEPT Provider Note   CSN: [id number] Arrival date & time: 05/02/17  1831     History   Chief Complaint Chief Complaint  Patient presents with  . Tachycardia    HPI Brenda Christian is a 61 y.o. female.  HPI Patient with history of asthma, thyroid disease comes in with chief complaint of palpitations. Patient reports that she woke up this morning and noted several rounds of palpitations and felt like her heart was racing.She went to [facility] urgent care where an EKG was done and it showed atrial flutter so she was advised to come to the emergency room. Patient has no known cardiac history, however she does have family history of A. Fib. Patient hasn't had any palpitations since she arrived to the emergency room. Pt has no hx of PE, DVT and denies any exogenous hormone (testosterone / estrogen) use, long distance travels or surgery in the past 6 weeks, active cancer, recent immobilization. Patient also denies any increased caffeine usage, heavy smoking, new medications, diet pills.  Past Medical History:  Diagnosis Date  . Allergy    cats  . Arthritis    knees  . Asthma   . Depression   . Thyroid disease     Patient Active Problem List   Diagnosis Date Noted  . Hashimoto's thyroiditis 04/21/2017  . Primary osteoarthritis of both knees 04/21/2017  . Class 3 severe obesity due to excess calories without serious comorbidity with body mass index (BMI) of 40.0 to 44.9 in adult (HCC) 04/21/2017  . Essential hypertension 04/21/2017    Past Surgical History:  Procedure Laterality Date  . OVARIAN CYST SURGERY    . TONSILLECTOMY    . WISDOM TOOTH EXTRACTION      OB History    No data available       Home Medications    Prior to Admission medications   Medication Sig Start Date End Date Taking? Authorizing Provider  BLACK ELDERBERRY,BERRY-FLOWER, PO Take 1 tablet by mouth at bedtime.   Yes [provider]  CALCIUM PO Take 1 tablet by mouth at  bedtime.   Yes [provider]  cholecalciferol (VITAMIN D) 400 units TABS tablet Take 10,000 Units by mouth daily.    Yes [provider]  Glucosamine Sulfate 1000 MG CAPS Take 1 capsule by mouth 2 (two) times daily.    Yes [provider]  Ibuprofen 200 MG CAPS Take 200 mg by mouth at bedtime as needed (pain).    Yes [provider]  IODINE-VITAMIN A PO Take 1 tablet by mouth daily.    Yes [provider]  levothyroxine (SYNTHROID, LEVOTHROID) 100 MCG tablet Take 1 tablet (100 mcg total) by mouth daily before breakfast. 04/18/17  Yes Ethelda Chick, MD  liothyronine (CYTOMEL) 25 MCG tablet Take 1 tablet (25 mcg total) by mouth daily. 04/18/17  Yes Ethelda Chick, MD  losartan (COZAAR) 25 MG tablet Take 1 tablet (25 mg total) by mouth 2 (two) times daily. 04/18/17  Yes Ethelda Chick, MD  Omega-3 Fatty Acids (FISH OIL) 1000 MG CAPS Take 1 capsule by mouth 2 (two) times daily.    Yes [provider]  Probiotic Product (PROBIOTIC PO) Take 1 tablet by mouth daily.   Yes [provider]  TURMERIC PO Take 2 tablets by mouth at bedtime.   Yes [provider]  aspirin EC 325 MG tablet Take 1 tablet (325 mg total) by mouth daily. 05/03/17   Derwood Kaplan, MD  metoprolol succinate (TOPROL-XL) 25 MG 24 hr tablet Take 1 tablet (25 mg total) by mouth daily. 05/03/17   Derwood Kaplan, MD    Family History Family History  Problem Relation Age of Onset  . Heart disease Mother   . Diabetes Mother   . Stroke Mother 22       CVA x 2  . Hyperlipidemia Father   . Stroke Maternal Grandmother   . Mental retardation Maternal Grandmother   . Heart disease Maternal Grandfather   . Cancer Paternal Grandmother   . Cancer Paternal Grandfather   . Heart disease Paternal Grandfather     Social History Social History  Substance Use Topics  . Smoking status: Never Smoker  . Smokeless tobacco: Never Used  . Alcohol use Yes      Allergies   Eggs or egg-derived products and Penicillins   Review of Systems Review of Systems  Constitutional: Positive for activity change.  Cardiovascular: Positive for palpitations.  All other systems reviewed and are negative.    Physical Exam Updated Vital Signs BP (!) 116/46   Pulse 72   Temp 98.3 F (36.8 C) (Oral)   Resp 19   Ht  (1.575 m)   SpO2 94%   Physical Exam  Constitutional: She is oriented to person, place, and time. She appears well-developed.  HENT:  Head: Normocephalic and atraumatic.  Eyes: EOM are normal.  Neck: Normal range of motion. Neck supple.  Cardiovascular: Normal rate.   Pulmonary/Chest: Effort normal.  Abdominal: Bowel sounds are normal.  Neurological: She is alert and oriented to person, place, and time.  Skin: Skin is warm and dry.  Nursing note and vitals reviewed.    ED Treatments / Results  Labs (all labs ordered are listed, but only abnormal results are displayed) Labs Reviewed  CBC WITH DIFFERENTIAL/PLATELET - Abnormal; Notable for the following:       Result Value   Hemoglobin 15.8 (*)    All other components within normal limits  COMPREHENSIVE METABOLIC PANEL - Abnormal; Notable for the following:    CO2 20 (*)    Glucose, Bld 118 (*)    All other components within normal limits  TSH - Abnormal; Notable for the following:    TSH 0.055 (*)    All other components within normal limits  URINALYSIS, ROUTINE W REFLEX MICROSCOPIC  D-DIMER, QUANTITATIVE (NOT AT [facility])  I-STAT TROPONIN, ED    EKG  EKG Interpretation  Date/Time:  Tuesday May 02 2017 18:44:29 EDT Ventricular Rate:  83 PR Interval:  160 QRS Duration: 80 QT Interval:  348 QTC Calculation: 408 R Axis:   35 Text Interpretation:  Normal sinus rhythm Anterior infarct , age undetermined Abnormal ECG [id number] noted No old tracing to compare Confirmed by Derwood Kaplan [phone]) on 05/02/2017 11:08:41 PM       Radiology Dg Chest 2  View  Result Date: 05/02/2017 CLINICAL DATA:  Rapid heart rate EXAM: CHEST  2 VIEW COMPARISON:  07/23/2016 FINDINGS: Mild cardiomegaly. No focal infiltrate or effusion. No pneumothorax. IMPRESSION: Mild cardiomegaly.  Negative for edema or infiltrate. Electronically Signed   By: Jasmine Pang M.D.   On: 05/02/2017 19:45    Procedures Procedures (including critical care time)  Medications Ordered in ED Medications - No data to display   Initial Impression / Assessment and Plan / ED Course  I have reviewed the triage vital signs and the nursing notes.  Pertinent labs & imaging results that were available during my care  of the patient were reviewed by me and considered in my medical decision making (see chart for details).     Patient comes in with chief complaint of palpitations. Patient is in sinus rhythm at this time. The EMS EKG tracing is also sinus rhythm. I reviewed the tracing at [facility] urgent care and it seems like patient had a rhythm with a heart rate in the 160s. Thw rhythm strip doesn't have clearly defined flutter waves, however with the heart rate being close to 150 A flutter is a distinct possibility.  This patients CHA2DS2-VASc Score and unadjusted Ischemic Stroke Rate (% per year) is equal to 0.2 % stroke rate/year from a score of 0  Above score calculated as 1 point each if present [CHF, HTN, DM, Vascular=MI/PAD/Aortic Plaque, Age if 65-74, or Female] Above score calculated as 2 points each if present [Age > 75, or Stroke/TIA/TE]   I discussed the case with cardiology fellow on call. He agrees that the best course of action would be to start patient on Toprol 25 mg, aspirin full dose and have her see the A. Fib clinic where she can be evaluated appropriately.  Results from the ER workup discussed with the patient face to face and all questions answered to the best of my ability.  Strict ER return precautions have been discussed, and patient is agreeing with the plan and  is comfortable with the workup done and the recommendations from the ER.   Final Clinical Impressions(s) / ED Diagnoses   Final diagnoses:  Palpitations  SVT (supraventricular tachycardia) (HCC)  Atrial flutter, unspecified type (HCC)    New Prescriptions New Prescriptions   ASPIRIN EC 325 MG TABLET    Take 1 tablet (325 mg total) by mouth daily.   METOPROLOL SUCCINATE (TOPROL-XL) 25 MG 24 HR TABLET    Take 1 tablet (25 mg total) by mouth daily.     Derwood Kaplan, MD 05/03/17 [phone]

## 2017-05-03 NOTE — Telephone Encounter (Signed)
Message routed to Dr Smith

## 2017-05-04 ENCOUNTER — Telehealth (HOSPITAL_COMMUNITY): Payer: Self-pay | Admitting: *Deleted

## 2017-05-04 NOTE — Telephone Encounter (Signed)
Pt referred from ED

## 2017-05-05 NOTE — Telephone Encounter (Signed)
Spoke with patient ---- ED visit due to tachycardia/palpitations.  Started ASA  and Metoprolol ER  daily.  No recurrent fluttering.  Heart rate running 59-65.  Heart rate had increased 110-153.  Discussed with patient abnormal TSH; repeat also abnormal.  Levothyroxine is really .  The last week, was not getting a full pill.  Now taking .  No longer going to see Dr. Alessandra Bevels.  A/P: Atrial flutter versus tachycardia/SVT with over-treatment of thyroid:  Decrease Levothyroxine to daily; decrease Cytomel to  1/2 daily.  BP 110/65, 106/62, 121/72.  Has continued Losartan; hold Losartan if systolic < 108/60.

## 2017-05-08 ENCOUNTER — Ambulatory Visit (HOSPITAL_COMMUNITY)
Admission: RE | Admit: 2017-05-08 | Discharge: 2017-05-08 | Disposition: A | Discharge status: Home / Self Care | Payer: BLUE CROSS/BLUE SHIELD | Source: Ambulatory Visit | Attending: Nurse Practitioner | Admitting: Nurse Practitioner

## 2017-05-08 ENCOUNTER — Encounter (HOSPITAL_COMMUNITY): Payer: Self-pay | Admitting: Nurse Practitioner

## 2017-05-08 VITALS — BP 138/84 | HR 70 | Ht 62.0 in

## 2017-05-08 DIAGNOSIS — Z833 Family history of diabetes mellitus: Secondary | ICD-10-CM | POA: Insufficient documentation

## 2017-05-08 DIAGNOSIS — Z7982 Long term (current) use of aspirin: Secondary | ICD-10-CM | POA: Diagnosis not present

## 2017-05-08 DIAGNOSIS — Z79899 Other long term (current) drug therapy: Secondary | ICD-10-CM | POA: Insufficient documentation

## 2017-05-08 DIAGNOSIS — I480 Paroxysmal atrial fibrillation: Secondary | ICD-10-CM | POA: Diagnosis not present

## 2017-05-08 DIAGNOSIS — J45909 Unspecified asthma, uncomplicated: Secondary | ICD-10-CM | POA: Insufficient documentation

## 2017-05-08 DIAGNOSIS — Z880 Allergy status to penicillin: Secondary | ICD-10-CM | POA: Diagnosis not present

## 2017-05-08 DIAGNOSIS — E079 Disorder of thyroid, unspecified: Secondary | ICD-10-CM | POA: Diagnosis not present

## 2017-05-08 DIAGNOSIS — I4892 Unspecified atrial flutter: Secondary | ICD-10-CM | POA: Diagnosis not present

## 2017-05-08 DIAGNOSIS — Z823 Family history of stroke: Secondary | ICD-10-CM | POA: Diagnosis not present

## 2017-05-08 DIAGNOSIS — Z8249 Family history of ischemic heart disease and other diseases of the circulatory system: Secondary | ICD-10-CM | POA: Diagnosis not present

## 2017-05-08 MED ORDER — METOPROLOL SUCCINATE ER 25 MG PO TB24: 25.0000 mg | ORAL_TABLET | Freq: Every day | ORAL | 3 refills | Status: DC

## 2017-05-09 NOTE — Progress Notes (Signed)
Primary Care Physician: Wardell Honour, MD Referring Physician: [facility] ER f/u   Brenda Christian is a 61 y.o. female with a h/o  asthma, thyroid disease comes in with chief complaint of palpitations. Patient reports that she woke up this morning and noted several rounds of palpitations and felt like her heart was racing.She went to [facility] urgent care where an EKG was done and it showed atrial flutter so she was advised to come to the emergency room. Patient has no known cardiac history, however she does have family history of A. Fib. She spontaneously converted to SR in the ER.  Pt is in the afib clinic for f/u. Only noted palpitations that day. None since then. She was found to be hyperthyroid and  thyroid replacement was adjusted since ER visit. She has chadsvasc scor of 2, female, hypertension. She was placed on toprol daily.  No alcohol, caffeine or tobacco use. No significant snoring history.  Today, she denies symptoms of palpitations, chest pain, shortness of breath, orthopnea, PND, lower extremity edema, dizziness, presyncope, syncope, or neurologic sequela. The patient is tolerating medications without difficulties and is otherwise without complaint today.   Past Medical History:  Diagnosis Date  . Allergy    cats  . Arthritis    knees  . Asthma   . Depression   . Thyroid disease    Past Surgical History:  Procedure Laterality Date  . OVARIAN CYST SURGERY    . TONSILLECTOMY    . WISDOM TOOTH EXTRACTION      Current Outpatient Prescriptions  Medication Sig Dispense Refill  . aspirin EC 325 MG tablet Take 1 tablet (325 mg total) by mouth daily. 30 tablet 0  . BLACK ELDERBERRY,BERRY-FLOWER, PO Take 1 tablet by mouth at bedtime.    Marland Kitchen CALCIUM PO Take 1 tablet by mouth at bedtime.    . cholecalciferol (VITAMIN D) 400 units TABS tablet Take 10,000 Units by mouth daily.     . Glucosamine Sulfate 1000 MG CAPS Take 1 capsule by mouth 2 (two) times daily.     . Ibuprofen 200 MG  CAPS Take 200 mg by mouth at bedtime as needed (pain).     Nani Ravens A PO Take 1 tablet by mouth daily.     Marland Kitchen levothyroxine (SYNTHROID, LEVOTHROID) 100 MCG tablet Take 1 tablet (100 mcg total) by mouth daily before breakfast. 90 tablet 3  . liothyronine (CYTOMEL) 25 MCG tablet Take 1 tablet (25 mcg total) by mouth daily. (Patient taking differently: Take 12.5 mcg by mouth daily. ) 90 tablet 3  . losartan (COZAAR) 25 MG tablet Take 1 tablet (25 mg total) by mouth 2 (two) times daily. 180 tablet 1  . metoprolol succinate (TOPROL-XL) 25 MG 24 hr tablet Take 1 tablet (25 mg total) by mouth daily. 30 tablet 3  . NON FORMULARY ossopan MD    . Omega-3 Fatty Acids (FISH OIL) 1000 MG CAPS Take 1 capsule by mouth 2 (two) times daily.     . Probiotic Product (PROBIOTIC PO) Take 1 tablet by mouth daily.    . TURMERIC PO Take 2 tablets by mouth at bedtime.     No current facility-administered medications for this encounter.     Allergies  Allergen Reactions  . Eggs Or Egg-Derived Products Other (See Comments)    Allergy test. Raw eggs   . Penicillins Swelling    Social History   Social History  . Marital status: Single    Spouse name: N/A  .  Number of children: N/A  . Years of education: N/A   Occupational History  . Not on file.   Social History Main Topics  . Smoking status: Never Smoker  . Smokeless tobacco: Never Used  . Alcohol use Yes  . Drug use: No  . Sexual activity: No   Other Topics Concern  . 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       Tobacco: none      Alcohol:  Rarely      Drugs; none      Exercise:  Tai Chi two days per week on Mondays and Wednesdays    Family History  Problem Relation Age of Onset  . Heart disease Mother   . Diabetes Mother   . Stroke Mother 43       CVA x 2  . Hyperlipidemia Father     . Stroke Maternal Grandmother   . Mental retardation Maternal Grandmother   . Heart disease Maternal Grandfather   . Cancer Paternal Grandmother   . Cancer Paternal Grandfather   . Heart disease Paternal Grandfather     ROS- All systems are reviewed and negative except as per the HPI above  Physical Exam: Vitals:   05/08/17 1332  BP: 138/84  Pulse: 70  Height: '5\' 2"'$  (1.575 m)   Wt Readings from Last 3 Encounters:  No data found for Wt    Labs: Lab Results  Component Value Date   NA 135 05/02/2017   K 4.0 05/02/2017   CL 106 05/02/2017   CO2 20 (L) 05/02/2017   GLUCOSE 118 (H) 05/02/2017   BUN 14 05/02/2017   CREATININE 0.77 05/02/2017   CALCIUM 9.1 05/02/2017   No results found for: INR Lab Results  Component Value Date   CHOL 217 (H) 04/18/2017   HDL 83 04/18/2017   LDLCALC 111 (H) 04/18/2017   TRIG 114 04/18/2017     GEN- The patient is well appearing, alert and oriented x 3 today.   Head- normocephalic, atraumatic Eyes-  Sclera clear, conjunctiva pink Ears- hearing intact Oropharynx- clear Neck- supple, no JVP Lymph- no cervical lymphadenopathy Lungs- Clear to ausculation bilaterally, normal work of breathing Heart- Regular rate and rhythm, no murmurs, rubs or gallops, PMI not laterally displaced GI- soft, NT, ND, + BS Extremities- no clubbing, cyanosis, or edema MS- no significant deformity or atrophy Skin- no rash or lesion Psych- euthymic mood, full affect Neuro- strength and sensation are intact  EKG-NSr at 70 bpm, pr int 166 ms, qrs int 82 ms, qtc 382 ms Epic records reviewed Ekg's in ER reviewed and appear to be an atrial flutter, vrs sinus tach    Assessment and Plan: 1. Possible atrial flutter vrs Sinus tach General education re fib/flutter Appears that being overmedicated on thyroid med may have contributed For now, agree with asa 325 mg for chadsvasc score of 2(htn, female) Continue Toprol for now, but if no reoccurrence in the next  1-2 months, consider using as needed Echo Will call results of echo If structural change , will refer to general cardiology If not, return to PCP, if has reoccurance of fib or flutter, above meds will need further consideration.  Geroge Baseman [facility] [place],  [place] [address] [phone]

## 2017-05-12 ENCOUNTER — Ambulatory Visit (HOSPITAL_COMMUNITY): Payer: BLUE CROSS/BLUE SHIELD

## 2017-06-05 ENCOUNTER — Ambulatory Visit (INDEPENDENT_AMBULATORY_CARE_PROVIDER_SITE_OTHER): Payer: BLUE CROSS/BLUE SHIELD | Admitting: Family Medicine

## 2017-06-05 ENCOUNTER — Encounter: Payer: Self-pay | Admitting: Family Medicine

## 2017-06-05 VITALS — BP 138/80 | HR 78 | Temp 98.0°F | Resp 16 | Ht 63.39 in

## 2017-06-05 DIAGNOSIS — Z6841 Body Mass Index (BMI) 40.0 and over, adult: Secondary | ICD-10-CM

## 2017-06-05 DIAGNOSIS — I10 Essential (primary) hypertension: Secondary | ICD-10-CM

## 2017-06-05 DIAGNOSIS — I483 Typical atrial flutter: Secondary | ICD-10-CM | POA: Diagnosis not present

## 2017-06-05 DIAGNOSIS — M170 Bilateral primary osteoarthritis of knee: Secondary | ICD-10-CM

## 2017-06-05 DIAGNOSIS — E063 Autoimmune thyroiditis: Secondary | ICD-10-CM

## 2017-06-05 NOTE — Patient Instructions (Signed)
     IF you received an x-ray today, you will receive an invoice from [facility]. Please contact [facility] at [phone] with questions or concerns regarding your invoice.   IF you received labwork today, you will receive an invoice from [facility]. Please contact [facility] at [phone] with questions or concerns regarding your invoice.   Our billing staff will not be able to assist you with questions regarding bills from these companies.  You will be contacted with the lab results as soon as they are available. The fastest way to get your results is to activate your My Chart account. Instructions are located on the last page of this paperwork. If you have not heard from us regarding the results in 2 weeks, please contact this office.     

## 2017-06-05 NOTE — Progress Notes (Signed)
Subjective:    Patient ID: Brenda Christian, female    DOB: [birth year], 61 y.o.   MRN: [id number]  06/05/2017  Hypothyroidism (2 month follow-up)    HPI This 61 y.o. female presents for evaluation of hypothyroidism, palpitations with new onset atrial fibrillation.   ED visit due to palpitations. 05/02/17 onset of BP 96/59 156. Nww, average heart rate 54-59 BP 115/68, 112/66 Dx of atrial flutter.  Did recommend echo yet uanble to afford 1200.   CXR showed cardiomegaly. Has avoided advil since episode.    Having worsening joint pain.  Decrease 178mg Levothyroxine 1049m daily.  Decrease Cytomel  2529mo 1/2 daily.    BP Readings from Last 3 Encounters:  06/05/17 138/80  05/08/17 138/84  05/03/17 112/68   Wt Readings from Last 3 Encounters:  No data found for Wt    There is no immunization history on file for this patient.  Review of Systems  Constitutional: Negative for chills, diaphoresis, fatigue and fever.  Eyes: Negative for visual disturbance.  Respiratory: Negative for cough and shortness of breath.   Cardiovascular: Positive for palpitations. Negative for chest pain and leg swelling.  Gastrointestinal: Negative for abdominal pain, constipation, diarrhea, nausea and vomiting.  Endocrine: Negative for cold intolerance, heat intolerance, polydipsia, polyphagia and polyuria.  Musculoskeletal: Positive for arthralgias.  Neurological: Negative for dizziness, tremors, seizures, syncope, facial asymmetry, speech difficulty, weakness, light-headedness, numbness and headaches.    Past Medical History:  Diagnosis Date  . Allergy    cats  . Arthritis    knees  . Asthma   . Depression   . Thyroid disease    Past Surgical History:  Procedure Laterality Date  . OVARIAN CYST SURGERY    . TONSILLECTOMY    . WISDOM TOOTH EXTRACTION     Allergies  Allergen Reactions  . Eggs Or Egg-Derived Products Other (See Comments)    Allergy test. Raw eggs   . Penicillins  Swelling   Current Outpatient Medications on File Prior to Visit  Medication Sig Dispense Refill  . aspirin EC 325 MG tablet Take 1 tablet (325 mg total) by mouth daily. 30 tablet 0  . BLACK ELDERBERRY,BERRY-FLOWER, PO Take 1 tablet by mouth at bedtime.    . CMarland KitchenLCIUM PO Take 1 tablet by mouth at bedtime.    . cholecalciferol (VITAMIN D) 400 units TABS tablet Take 10,000 Units by mouth daily.     . Glucosamine Sulfate 1000 MG CAPS Take 1 capsule by mouth 2 (two) times daily.     . Ibuprofen 200 MG CAPS Take 200 mg by mouth at bedtime as needed (pain).     . INani RavensPO Take 1 tablet by mouth daily.     . lMarland Kitchenvothyroxine (SYNTHROID, LEVOTHROID) 100 MCG tablet Take 1 tablet (100 mcg total) by mouth daily before breakfast. 90 tablet 3  . liothyronine (CYTOMEL) 25 MCG tablet Take 1 tablet (25 mcg total) by mouth daily. (Patient taking differently: Take 12.5 mcg by mouth daily. ) 90 tablet 3  . losartan (COZAAR) 25 MG tablet Take 1 tablet (25 mg total) by mouth 2 (two) times daily. 180 tablet 1  . metoprolol succinate (TOPROL-XL) 25 MG 24 hr tablet Take 1 tablet (25 mg total) by mouth daily. 30 tablet 3  . NON FORMULARY ossopan MD    . Omega-3 Fatty Acids (FISH OIL) 1000 MG CAPS Take 1 capsule by mouth 2 (two) times daily.     . Probiotic Product (PROBIOTIC PO) Take 1 tablet by  mouth daily.    . TURMERIC PO Take 2 tablets by mouth at bedtime.     No current facility-administered medications on file prior to visit.    Social History   Socioeconomic History  . Marital status: Single    Spouse name: Not on file  . Number of children: Not on file  . Years of education: Not on file  . Highest education level: Not on file  Social Needs  . Financial resource strain: Not on file  . Food insecurity - worry: Not on file  . Food insecurity - inability: Not on file  . Transportation needs - medical: Not on file  . Transportation needs - non-medical: Not on file  Occupational History  . Not on  file  Tobacco Use  . Smoking status: Never Smoker  . Smokeless tobacco: Never Used  Substance and Sexual Activity  . Alcohol use: Yes  . Drug use: No  . Sexual activity: No  Other Topics Concern  . 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       Tobacco: none      Alcohol:  Rarely      Drugs; none      Exercise:  Tai Chi two days per week on Mondays and Wednesdays   Family History  Problem Relation Age of Onset  . Heart disease Mother   . Diabetes Mother   . Stroke Mother 81       CVA x 2  . Hyperlipidemia Father   . Stroke Maternal Grandmother   . Mental retardation Maternal Grandmother   . Heart disease Maternal Grandfather   . Cancer Paternal Grandmother   . Cancer Paternal Grandfather   . Heart disease Paternal Grandfather        Objective:    BP 138/80   Pulse 78   Temp 98 F (36.7 C) (Oral)   Resp 16   Ht 5' 3.39" (1.61 m)   SpO2 96%  Physical Exam  Constitutional: She is oriented to person, place, and time. She appears well-developed and well-nourished. No distress.  HENT:  Head: Normocephalic and atraumatic.  Right Ear: External ear normal.  Left Ear: External ear normal.  Nose: Nose normal.  Mouth/Throat: Oropharynx is clear and moist.  Eyes: Conjunctivae and EOM are normal. Pupils are equal, round, and reactive to light.  Neck: Normal range of motion. Neck supple. Carotid bruit is not present. No thyromegaly present.  Cardiovascular: Normal rate, regular rhythm, normal heart sounds and intact distal pulses. Exam reveals no gallop and no friction rub.  No murmur heard. Pulmonary/Chest: Effort normal and breath sounds normal. She has no wheezes. She has no rales.  Abdominal: Soft. Bowel sounds are normal. She exhibits no distension and no mass. There is no tenderness. There is no rebound and no guarding.    Lymphadenopathy:    She has no cervical adenopathy.  Neurological: She is alert and oriented to person, place, and time. No cranial nerve deficit.  Skin: Skin is warm and dry. No rash noted. She is not diaphoretic. No erythema. No pallor.  Psychiatric: She has a normal mood and affect. Her behavior is normal.   No results found. Depression screen [facility] 2/9 06/05/2017 05/02/2017 04/18/2017 07/23/2016  Decreased Interest 0 0 0 0  Down, Depressed, Hopeless 0 0 0 0  PHQ - 2 Score 0 0 0 0   Fall Risk  06/05/2017 05/02/2017 04/18/2017 07/23/2016  Falls in the past year? No No No No        Assessment & Plan:   1. Typical atrial flutter ([place])   2. Hashimoto's thyroiditis   3. Essential hypertension   4. Primary osteoarthritis of both knees   5. Class 3 severe obesity due to excess calories without serious comorbidity with body mass index (BMI) of 40.0 to 44.9 in adult [address] [facility])     New onset atrial flutter associated with overcorrection hypothyroidism.  Status post ED visit.  Records reviewed in detail during visit.  Status post cardiology follow-up after emergency room visit.  No recurrent atrial flutter since hospital discharge.  Continue Metoprolol ER '25mg'$  daily. Continue ASA '325mg'$  per cardiology.  Scheduled for echo.  Blood pressures have been stable at home on current dose of losartan.  No changes to therapy at this time.  Continue Losartan '25mg'$  1/2 daily.  Patient tolerating lower dose of thyroid supplementation.  Repeat thyroid levels today.  Patient suffering with worsening pain of bilateral knees.  Recommend rest, stretching, icing of the knees.  recommend weight loss, exercise for 30-60 minutes five days per week; recommend 1200 kcal restriction per day with a minimum of 60 grams of protein per day.   Orders Placed This Encounter  Procedures  . TSH  . T4, free  . Comprehensive metabolic panel   No orders of the defined types were placed in this encounter.   No Follow-up on  file.   Nancy Manuele Elayne Guerin, M.D. Primary Care at [facility] previously [facility] [address] [place],   [phone] phone [phone] fax

## 2017-06-06 LAB — T4, FREE: FREE T4: 1.11 ng/dL (ref 0.82–1.77)

## 2017-06-06 LAB — COMPREHENSIVE METABOLIC PANEL
A/G RATIO: 1.7 (ref 1.2–2.2)
ALBUMIN: 4 g/dL (ref 3.6–4.8)
ALK PHOS: 64 IU/L (ref 39–117)
ALT: 14 IU/L (ref 0–32)
AST: 17 IU/L (ref 0–40)
BUN / CREAT RATIO: 19 (ref 12–28)
BUN: 12 mg/dL (ref 8–27)
CHLORIDE: 102 mmol/L (ref 96–106)
CO2: 23 mmol/L (ref 20–29)
Calcium: 9.3 mg/dL (ref 8.7–10.3)
Creatinine, Ser: 0.62 mg/dL (ref 0.57–1.00)
GFR calc Af Amer: 112 mL/min/{1.73_m2} (ref >59)
GFR calc non Af Amer: 97 mL/min/{1.73_m2} (ref >59)
GLUCOSE: 114 mg/dL — AB (ref 65–99)
Globulin, Total: 2.3 g/dL (ref 1.5–4.5)
POTASSIUM: 4.3 mmol/L (ref 3.5–5.2)
Sodium: 142 mmol/L (ref 134–144)
Total Protein: 6.3 g/dL (ref 6.0–8.5)

## 2017-06-06 LAB — TSH: TSH: 0.607 u[IU]/mL (ref 0.450–4.500)

## 2017-06-27 ENCOUNTER — Encounter: Payer: Self-pay | Admitting: Family Medicine

## 2017-06-27 DIAGNOSIS — I483 Typical atrial flutter: Secondary | ICD-10-CM | POA: Insufficient documentation

## 2017-07-07 ENCOUNTER — Other Ambulatory Visit (HOSPITAL_COMMUNITY): Payer: Self-pay | Admitting: *Deleted

## 2017-07-07 MED ORDER — METOPROLOL SUCCINATE ER 25 MG PO TB24: 25.0000 mg | ORAL_TABLET | Freq: Every day | ORAL | 1 refills | Status: DC

## 2017-07-11 ENCOUNTER — Other Ambulatory Visit (HOSPITAL_COMMUNITY): Payer: Self-pay | Admitting: *Deleted

## 2017-07-11 MED ORDER — METOPROLOL SUCCINATE ER 25 MG PO TB24: 25.0000 mg | ORAL_TABLET | Freq: Every day | ORAL | 1 refills | Status: DC

## 2017-10-18 ENCOUNTER — Encounter: Payer: Self-pay | Admitting: Family Medicine

## 2017-10-18 ENCOUNTER — Ambulatory Visit (INDEPENDENT_AMBULATORY_CARE_PROVIDER_SITE_OTHER): Payer: BLUE CROSS/BLUE SHIELD | Admitting: Family Medicine

## 2017-10-18 ENCOUNTER — Other Ambulatory Visit: Payer: Self-pay

## 2017-10-18 VITALS — BP 160/80 | HR 75 | Temp 98.0°F | Ht 63.78 in

## 2017-10-18 DIAGNOSIS — S46212A Strain of muscle, fascia and tendon of other parts of biceps, left arm, initial encounter: Secondary | ICD-10-CM | POA: Diagnosis not present

## 2017-10-18 DIAGNOSIS — M7661 Achilles tendinitis, right leg: Secondary | ICD-10-CM | POA: Diagnosis not present

## 2017-10-18 DIAGNOSIS — I10 Essential (primary) hypertension: Secondary | ICD-10-CM

## 2017-10-18 DIAGNOSIS — Z6841 Body Mass Index (BMI) 40.0 and over, adult: Secondary | ICD-10-CM

## 2017-10-18 DIAGNOSIS — E063 Autoimmune thyroiditis: Secondary | ICD-10-CM

## 2017-10-18 DIAGNOSIS — I483 Typical atrial flutter: Secondary | ICD-10-CM | POA: Diagnosis not present

## 2017-10-18 DIAGNOSIS — M170 Bilateral primary osteoarthritis of knee: Secondary | ICD-10-CM | POA: Diagnosis not present

## 2017-10-18 MED ORDER — METOPROLOL SUCCINATE ER 25 MG PO TB24: 25.0000 mg | ORAL_TABLET | Freq: Every day | ORAL | 1 refills | Status: DC

## 2017-10-18 NOTE — Patient Instructions (Addendum)
IF you received an x-ray today, you will receive an invoice from [facility]. Please contact [facility] at [phone] with questions or concerns regarding your invoice.   IF you received labwork today, you will receive an invoice from [facility]. Please contact [facility] at [phone] with questions or concerns regarding your invoice.   Our billing staff will not be able to assist you with questions regarding bills from these companies.  You will be contacted with the lab results as soon as they are available. The fastest way to get your results is to activate your My Chart account. Instructions are located on the last page of this paperwork. If you have not heard from us regarding the results in 2 weeks, please contact this office.      Biceps Tendon Tendinitis (Proximal) and Tenosynovitis Rehab Ask your health care provider which exercises are safe for you. Do exercises exactly as told by your health care provider and adjust them as directed. It is normal to feel mild stretching, pulling, tightness, or discomfort as you do these exercises, but you should stop right away if you feel sudden pain or your pain gets worse.Do not begin these exercises until told by your health care provider. Stretching and range of motion exercises These exercises warm up your muscles and joints and improve the movement and flexibility of your arm and shoulder. These exercises also help to relieve pain and stiffness. Exercise A: Shoulder flexion  1. Stand facing a wall. Put your left / right hand on the wall. 2. Slide your left / right hand up the wall. Stop when you feel a stretch in your shoulder, or when you reach the angle that is recommended by your health care provider. ? Use your other hand to help raise your arm, if needed. ? As your hand gets higher, you may need to step closer to the wall. ? Avoid shrugging your shoulder while you raise your arm. To do this, keep your shoulder  blade tucked down toward your spine. 3. Hold for __________ seconds. 4. Slowly return to the starting position. Use your other arm to help, if needed. Repeat __________ times. Complete this exercise __________ times a day. Exercise B: Posterior capsule stretch ( passive horizontal adduction) 1. Sit or stand and pull your left / right elbow across your chest, toward your other shoulder. Stop when you feel a gentle stretch in the back of your shoulder and upper arm. ? Keep your arm at shoulder height. ? Keep your arm as close to your body as you comfortably can. 2. Hold for __________ seconds. 3. Slowly return to the starting position. Repeat __________ times. Complete this exercise __________ times a day. Strengthening exercises These exercises build strength and endurance in your arm and shoulder. Endurance is the ability to use your muscles for a long time, even after your muscles get tired. Exercise C: Elbow flexion, supinated  1. Sit on a stable chair without armrests, or stand. 2. If directed, hold a __________ weight in your left / right hand, or hold an exercise band with both hands. Your palms should face up toward the ceiling at the starting position. 3. Bend your left / right elbow and move your hand up toward your shoulder. Keep your other arm straight down, in the starting position. 4. Slowly return to the starting position. Repeat __________ times. Complete this exercise __________ times a day. Exercise D: Scapular protraction, supine  1. Lie on your back on a firm surface.  If directed, hold a __________ weight in your left / right hand. 2. Raise your left / right arm straight into the air so your hand is directly above your shoulder joint. 3. Push the weight into the air so your shoulder lifts off of the surface that you are lying on. Do not move your head, neck, or back. 4. Hold for __________ seconds. 5. Slowly return to the starting position. Let your muscles relax  completely before you repeat this exercise. Repeat __________ times. Complete this exercise __________ times a day. Exercise E: Scapular retraction  1. Sit in a stable chair without armrests, or stand. 2. Secure an exercise band to a stable object in front of you so the band is at shoulder height. 3. Hold one end of the exercise band in each hand. 4. Squeeze your shoulder blades together and move your elbows slightly behind you. Do not shrug your shoulders. 5. Hold for __________ seconds. 6. Slowly return to the starting position. Repeat __________ times. Complete this exercise __________ times a day. This information is not intended to replace advice given to you by your health care provider. Make sure you discuss any questions you have with your health care provider. Document Released: 07/18/2005 Document Revised: 03/24/2016 Document Reviewed: 06/26/2015 Elsevier Interactive Patient Education  2018 [employer].  Achilles Tendinitis Rehab Ask your health care provider which exercises are safe for you. Do exercises exactly as told by your health care provider and adjust them as directed. It is normal to feel mild stretching, pulling, tightness, or discomfort as you do these exercises, but you should stop right away if you feel sudden pain or your pain gets worse. Do not begin these exercises until told by your health care provider. Stretching and range of motion exercises These exercises warm up your muscles and joints and improve the movement and flexibility of your ankle. These exercises also help to relieve pain, numbness, and tingling. Exercise A: Standing wall calf stretch, knee straight  1. Stand with your hands against a wall. 2. Extend your __________ leg behind you and bend your front knee slightly. Keep both of your heels on the floor. 3. Point the toes of your back foot slightly inward. 4. Keeping your heels on the floor and your back knee straight, shift your weight toward the  wall. Do not allow your back to arch. You should feel a gentle stretch in your calf. 5. Hold this position for seconds. Repeat __________ times. Complete this stretch __________ times per day. Exercise B: Standing wall calf stretch, knee bent 1. Stand with your hands against a wall. 2. Extend your __________ leg behind you, and bend your front knee slightly. Keep both of your heels on the floor. 3. Point the toes of your back foot slightly inward. 4. Keeping your heels on the floor, unlock your back knee so that it is bent. You should feel a gentle stretch deep in your calf. 5. Hold this position for __________ seconds. Repeat __________ times. Complete this stretch __________ times per day. Strengthening exercises These exercises build strength and control of your ankle. Endurance is the ability to use your muscles for a long time, even after they get tired. Exercise C: Plantar flexion with band  1. Sit on the floor with your __________ leg extended. You may put a pillow under your calf to give your foot more room to move. 2. Loop a rubber exercise band or tube around the ball of your __________ foot. The ball of  your foot is on the walking surface, right under your toes. The band or tube should be slightly tense when your foot is relaxed. If the band or tube slips, you can put on your shoe or put a washcloth between the band and your foot to help it stay in place. 3. Slowly point your toes downward, pushing them away from you. 4. Hold this position for __________ seconds. 5. Slowly release the tension in the band or tube, controlling smoothly until your foot is back to the starting position. Repeat __________ times. Complete this exercise __________ times per day. Exercise D: Heel raise with eccentric lower  1. Stand on a step with the balls of your feet. The ball of your foot is on the walking surface, right under your toes. ? Do not put your heels on the step. ? For balance, rest your  hands on the wall or on a railing. 2. Rise up onto the balls of your feet. 3. Keeping your heels up, shift all of your weight to your __________ leg and pick up your other leg. 4. Slowly lower your __________ leg so your heel drops below the level of the step. 5. Put down your foot. If told by your health care provider, build up to:  3 sets of 15 repetitions while keeping your knees straight.  3 sets of 15 repetitions while keeping your knees bent as far as told by your health care provider.  Complete this exercise __________ times per day. If this exercise is too easy, try doing it while wearing a backpack with weights in it. Balance exercises These exercises improve or maintain your balance. Balance is important in preventing falls. Exercise E: Single leg stand 1. Without shoes, stand near a railing or in a door frame. Hold on to the railing or door frame as needed. 2. Stand on your __________ foot. Keep your big toe down on the floor and try to keep your arch lifted. 3. Hold this position for __________ seconds. Repeat __________ times. Complete this exercise __________ times per day. If this exercise is too easy, you can try it with your eyes closed or while standing on a pillow. This information is not intended to replace advice given to you by your health care provider. Make sure you discuss any questions you have with your health care provider. Document Released: 02/16/2005 Document Revised: 03/24/2016 Document Reviewed: 03/24/2015 Elsevier Interactive Patient Education  [employer].

## 2017-10-18 NOTE — Progress Notes (Signed)
Subjective:    Patient ID: Brenda Christian, female    DOB: 1956/01/10, 62 y.o.   MRN: [id number]  10/18/2017  Chronic Conditions (3 month follow-up )    HPI This 62 y.o. female presents for four month follow-up of hypertension, atrial flutter, hypothyroidism, osteoarthritis bilateral knees. Average 32 days 113/69 Evening average BP 114/67 Morning BP average 7 days 112/70 Evening BP average 7 days 114/67  Having some palpitations mild.  Spontaneously resolves.  BP stabliizes during events.  No tachycardia.   R heel bursitis: annoying.  Walking in shoes is irritating.  Started improving earlier in week but then worsens.  Cousin has been in hospital.  Posterior heel and outside of heel; this morning, very tight.     BP Readings from Last 3 Encounters:  10/18/17 (!) 160/80  06/05/17 138/80  05/08/17 138/84   Wt Readings from Last 3 Encounters:  No data found for Wt    There is no immunization history on file for this patient.  Review of Systems  Constitutional: Negative for chills, diaphoresis, fatigue and fever.  Eyes: Negative for visual disturbance.  Respiratory: Negative for cough and shortness of breath.   Cardiovascular: Positive for palpitations. Negative for chest pain and leg swelling.  Gastrointestinal: Negative for abdominal pain, constipation, diarrhea, nausea and vomiting.  Endocrine: Negative for cold intolerance, heat intolerance, polydipsia, polyphagia and polyuria.  Musculoskeletal: Positive for arthralgias and gait problem.  Neurological: Negative for dizziness, tremors, seizures, syncope, facial asymmetry, speech difficulty, weakness, light-headedness, numbness and headaches.  Psychiatric/Behavioral: Negative for dysphoric mood.    Past Medical History:  Diagnosis Date  . Allergy    cats  . Arthritis    knees  . Asthma   . Depression   . Thyroid disease    Past Surgical History:  Procedure Laterality Date  . OVARIAN CYST SURGERY    .  TONSILLECTOMY    . WISDOM TOOTH EXTRACTION     Allergies  Allergen Reactions  . Eggs Or Egg-Derived Products Other (See Comments)    Allergy test. Raw eggs   . Penicillins Swelling   Current Outpatient Medications on File Prior to Visit  Medication Sig Dispense Refill  . aspirin EC 325 MG tablet Take 1 tablet (325 mg total) by mouth daily. 30 tablet 0  . BLACK ELDERBERRY,BERRY-FLOWER, PO Take 1 tablet by mouth at bedtime.    . Glucosamine Sulfate 1000 MG CAPS Take 1 capsule by mouth 2 (two) times daily.     Marland Kitchen levothyroxine (SYNTHROID, LEVOTHROID) 100 MCG tablet Take 1 tablet (100 mcg total) by mouth daily before breakfast. 90 tablet 3  . liothyronine (CYTOMEL) 25 MCG tablet Take 1 tablet (25 mcg total) by mouth daily. (Patient taking differently: Take 12.5 mcg by mouth daily. ) 90 tablet 3  . NON FORMULARY ossopan MD    . Omega-3 Fatty Acids (FISH OIL) 1000 MG CAPS Take 1 capsule by mouth 2 (two) times daily.     . Probiotic Product (PROBIOTIC PO) Take 1 tablet by mouth daily.    . TURMERIC PO Take 2 tablets by mouth at bedtime.    . Vitamin D, Ergocalciferol, (DRISDOL) 50000 units CAPS capsule Take 50,000 Units by mouth every 7 (seven) days.     No current facility-administered medications on file prior to visit.    Social History   Socioeconomic History  . Marital status: Single    Spouse name: Not on file  . Number of children: Not on file  . Years of  education: Not on file  . Highest education level: Not on file  Occupational History  . Not on file  Social Needs  . Financial resource strain: Not on file  . Food insecurity:    Worry: Not on file    Inability: Not on file  . Transportation needs:    Medical: Not on file    Non-medical: Not on file  Tobacco Use  . Smoking status: Never Smoker  . Smokeless tobacco: Never Used  Substance and Sexual Activity  . Alcohol use: Yes  . Drug use: No  . Sexual activity: Never  Lifestyle  . Physical activity:    Days per  week: Not on file    Minutes per session: Not on file  . Stress: Not on file  Relationships  . Social connections:    Talks on phone: Not on file    Gets together: Not on file    Attends religious service: Not on file    Active member of club or organization: Not on file    Attends meetings of clubs or organizations: Not on file    Relationship status: Not on file  . Intimate partner violence:    Fear of current or ex partner: Not on file    Emotionally abused: Not on file    Physically abused: Not on file    Forced sexual activity: Not on file  Other Topics Concern  . 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       Tobacco: none      Alcohol:  Rarely      Drugs; none      Exercise:  Tai Chi two days per week on Mondays and Wednesdays   Family History  Problem Relation Age of Onset  . Heart disease Mother   . Diabetes Mother   . Stroke Mother 70       CVA x 2  . Hyperlipidemia Father   . Stroke Maternal Grandmother   . Mental retardation Maternal Grandmother   . Heart disease Maternal Grandfather   . Cancer Paternal Grandmother   . Cancer Paternal Grandfather   . Heart disease Paternal Grandfather        Objective:    BP (!) 160/80 (BP Location: Right Arm, Patient Position: Sitting, Cuff Size: Large)   Pulse 75   Temp 98 F (36.7 C) (Oral)   Ht 5' 3.78" (1.62 m)   SpO2 96%  Physical Exam  Constitutional: She is oriented to person, place, and time. She appears well-developed and well-nourished. No distress.  HENT:  Head: Normocephalic and atraumatic.  Right Ear: External ear normal.  Left Ear: External ear normal.  Nose: Nose normal.  Mouth/Throat: Oropharynx is clear and moist.  Eyes: Pupils are equal, round, and reactive to light. Conjunctivae and EOM are normal.  Neck: Normal range of motion. Neck supple. Carotid  bruit is not present. No thyromegaly present.  Cardiovascular: Normal rate, regular rhythm, normal heart sounds and intact distal pulses. Exam reveals no gallop and no friction rub.  No murmur heard. Pulmonary/Chest: Effort normal and breath sounds normal. She has no wheezes. She has no rales.  Abdominal: Soft. Bowel sounds are normal. She exhibits no distension and no mass. There is no tenderness. There is no rebound and  no guarding.  Musculoskeletal:       Left shoulder: She exhibits tenderness and pain. She exhibits normal range of motion, no bony tenderness, no swelling, no deformity, no spasm, normal pulse and normal strength.       Right ankle: She exhibits normal range of motion. No tenderness. No lateral malleolus and no medial malleolus tenderness found. Achilles tendon exhibits pain. Achilles tendon exhibits no defect and normal Thompson's test results.       Right foot: Normal. There is normal range of motion, no tenderness and no bony tenderness.  Lymphadenopathy:    She has no cervical adenopathy.  Neurological: She is alert and oriented to person, place, and time. No cranial nerve deficit.  Skin: Skin is warm and dry. No rash noted. She is not diaphoretic. No erythema. No pallor.  Psychiatric: She has a normal mood and affect. Her behavior is normal.   No results found. Depression screen [facility] 2/9 10/18/2017 06/05/2017 05/02/2017 04/18/2017 07/23/2016  Decreased Interest 0 0 0 0 0  Down, Depressed, Hopeless 0 0 0 0 0  PHQ - 2 Score 0 0 0 0 0   Fall Risk  10/18/2017 06/05/2017 05/02/2017 04/18/2017 07/23/2016  Falls in the past year? _0         Assessment & Plan:   1. Essential hypertension   2. Typical atrial flutter ([place])   3. Hashimoto's thyroiditis   4. Right Achilles tendinitis   5. Strain of left biceps, initial encounter   6. Class 3 severe obesity due to excess calories without serious comorbidity with body mass index (BMI) of 40.0 to 44.9 in adult [facility])      Stable chronic medical conditions; obtain labs for chronic disease management; no changes to management.   R achillis tendonitis: New; recommend rest, icing, stretches; supportive tennis shoes for ambulation.  L bicep sprain: New; recommend rest, icing, stretches daily  Avoid heavy lifting greater than ten pounds.  Obesity: Recommend weight loss, exercise for 30-60 minutes five days per week; recommend 1200 kcal restriction per day with a minimum of 60 grams of protein per day.  Eat 3 meals per day. Do not skip meals. Consider having a protein shake as a meal replacement to aid with eliminating meal skipping. Look for products with <220 calories, <7 gm sugar, and 20-30 gm protein.  Eat breakfast within 2 hours of getting up.   Make  your plate non-starchy vegetables,  protein, and  carbohydrates at lunch and dinner.   Aim for at least 64 oz. of calorie-free beverages daily (water, Crystal Light, diet green tea, etc.). Eliminate any sugary beverages such as regular soda, sweet tea, or fruit juice.   Pay attention to hunger and fullness cues.  Stop eating once you feel satisfied; don't wait until you feel full, stuffed, or sick from eating.  Choose lean meats and low fat/fat free dairy products.  Choose foods high in fiber such as fruits, vegetables, and whole grains (brown rice, whole wheat pasta, whole wheat bread, etc.).  Limit foods with added sugar to <7 gm per serving.  Always eat in the kitchen/dining room.  Never eat in the bedroom or in front of the TV.     Orders Placed This Encounter  Procedures  . Comprehensive metabolic panel  . TSH  . T3, Free   Meds ordered this encounter  Medications  . metoprolol succinate (TOPROL-XL) 25 MG 24 hr tablet    Sig: Take 1 tablet (25 mg total)  by mouth daily.    Dispense:  90 tablet    Refill:  1    Return in about 6 months (around 04/20/2018) for complete physical examiniation.   Kristi Elayne Guerin, M.D. Primary Care  at [facility] previously [facility] [address] [place], [place]  [address] [phone] phone [phone] fax

## 2017-10-19 LAB — COMPREHENSIVE METABOLIC PANEL
ALBUMIN: 4 g/dL (ref 3.6–4.8)
ALK PHOS: 62 IU/L (ref 39–117)
ALT: 17 IU/L (ref 0–32)
AST: 18 IU/L (ref 0–40)
Albumin/Globulin Ratio: 1.6 (ref 1.2–2.2)
BILIRUBIN TOTAL: 0.3 mg/dL (ref 0.0–1.2)
BUN / CREAT RATIO: 18 (ref 12–28)
BUN: 13 mg/dL (ref 8–27)
CHLORIDE: 103 mmol/L (ref 96–106)
CO2: 25 mmol/L (ref 20–29)
Calcium: 9.2 mg/dL (ref 8.7–10.3)
Creatinine, Ser: 0.73 mg/dL (ref 0.57–1.00)
GFR calc Af Amer: 102 mL/min/{1.73_m2} (ref >59)
GFR calc non Af Amer: 89 mL/min/{1.73_m2} (ref >59)
GLOBULIN, TOTAL: 2.5 g/dL (ref 1.5–4.5)
Glucose: 84 mg/dL (ref 65–99)
POTASSIUM: 4.8 mmol/L (ref 3.5–5.2)
SODIUM: 144 mmol/L (ref 134–144)
Total Protein: 6.5 g/dL (ref 6.0–8.5)

## 2017-10-19 LAB — TSH: TSH: 1.34 u[IU]/mL (ref 0.450–4.500)

## 2017-10-19 LAB — T3, FREE: T3 FREE: 4.2 pg/mL (ref 2.0–4.4)

## 2017-11-05 ENCOUNTER — Other Ambulatory Visit: Payer: Self-pay | Admitting: Family Medicine

## 2017-11-09 ENCOUNTER — Encounter: Payer: Self-pay | Admitting: Family Medicine

## 2017-12-26 ENCOUNTER — Encounter: Payer: Self-pay | Admitting: Family Medicine

## 2018-03-20 ENCOUNTER — Ambulatory Visit: Payer: BLUE CROSS/BLUE SHIELD | Admitting: Physician Assistant

## 2018-04-06 ENCOUNTER — Encounter: Payer: Self-pay | Admitting: Physician Assistant

## 2018-04-06 ENCOUNTER — Ambulatory Visit (INDEPENDENT_AMBULATORY_CARE_PROVIDER_SITE_OTHER): Payer: BLUE CROSS/BLUE SHIELD | Admitting: Physician Assistant

## 2018-04-06 ENCOUNTER — Other Ambulatory Visit: Payer: Self-pay

## 2018-04-06 VITALS — BP 160/82 | HR 72 | Temp 98.0°F | Resp 16 | Ht 63.58 in

## 2018-04-06 DIAGNOSIS — I483 Typical atrial flutter: Secondary | ICD-10-CM | POA: Diagnosis not present

## 2018-04-06 DIAGNOSIS — I10 Essential (primary) hypertension: Secondary | ICD-10-CM | POA: Diagnosis not present

## 2018-04-06 DIAGNOSIS — E063 Autoimmune thyroiditis: Secondary | ICD-10-CM

## 2018-04-06 DIAGNOSIS — Z6841 Body Mass Index (BMI) 40.0 and over, adult: Secondary | ICD-10-CM

## 2018-04-06 MED ORDER — LIOTHYRONINE SODIUM 25 MCG PO TABS: 12.5000 ug | ORAL_TABLET | Freq: Every day | ORAL | 1 refills | Status: AC

## 2018-04-06 MED ORDER — LEVOTHYROXINE SODIUM 100 MCG PO TABS: 100.0000 ug | ORAL_TABLET | Freq: Every day | ORAL | 1 refills | Status: AC

## 2018-04-06 MED ORDER — LOSARTAN POTASSIUM 25 MG PO TABS: 25.0000 mg | ORAL_TABLET | Freq: Two times a day (BID) | ORAL | 1 refills | Status: AC

## 2018-04-06 MED ORDER — METOPROLOL SUCCINATE ER 25 MG PO TB24: 25.0000 mg | ORAL_TABLET | Freq: Every day | ORAL | 1 refills | Status: DC

## 2018-04-06 MED ORDER — LOSARTAN POTASSIUM 25 MG PO TABS: 25.0000 mg | ORAL_TABLET | Freq: Two times a day (BID) | ORAL | 1 refills | Status: DC

## 2018-04-06 NOTE — Patient Instructions (Addendum)
It was a pleasure meeting you today. I have given you 6 months worth of refills. Please follow up in 6 months in office. Thank you for letting me participate in your health and well being.     If you have lab work done today you will be contacted with your lab results within the next 2 weeks.  If you have not heard from [place] then please contact us. The fastest way to get your results is to register for My Chart.   DASH Eating Plan DASH stands for "Dietary Approaches to Stop Hypertension." The DASH eating plan is a healthy eating plan that has been shown to reduce high blood pressure (hypertension). It may also reduce your risk for type 2 diabetes, heart disease, and stroke. The DASH eating plan may also help with weight loss. What are tips for following this plan? General guidelines  Avoid eating more than 2,300 mg (milligrams) of salt (sodium) a day. If you have hypertension, you may need to reduce your sodium intake to 1,500 mg a day.  Limit alcohol intake to no more than 1 drink a day for nonpregnant women and 2 drinks a day for men. One drink equals 12 oz of beer, 5 oz of wine, or 1 oz of hard liquor.  Work with your health care provider to maintain a healthy body weight or to lose weight. Ask what an ideal weight is for you.  Get at least 30 minutes of exercise that causes your heart to beat faster (aerobic exercise) most days of the week. Activities may include walking, swimming, or biking.  Work with your health care provider or diet and nutrition specialist (dietitian) to adjust your eating plan to your individual calorie needs. Reading food labels  Check food labels for the amount of sodium per serving. Choose foods with less than 5 percent of the Daily Value of sodium. Generally, foods with less than 300 mg of sodium per serving fit into this eating plan.  To find whole grains, look for the word "whole" as the first word in the ingredient list. Shopping  Buy products labeled as  "low-sodium" or "no salt added."  Buy fresh foods. Avoid canned foods and premade or frozen meals. Cooking  Avoid adding salt when cooking. Use salt-free seasonings or herbs instead of table salt or sea salt. Check with your health care provider or pharmacist before using salt substitutes.  Do not fry foods. Cook foods using healthy methods such as baking, boiling, grilling, and broiling instead.  Cook with heart-healthy oils, such as olive, canola, soybean, or sunflower oil. Meal planning   Eat a balanced diet that includes: ? 5 or more servings of fruits and vegetables each day. At each meal, try to fill half of your plate with fruits and vegetables. ? Up to 6-8 servings of whole grains each day. ? Less than 6 oz of lean meat, poultry, or fish each day. A 3-oz serving of meat is about the same size as a deck of cards. One egg equals 1 oz. ? 2 servings of low-fat dairy each day. ? A serving of nuts, seeds, or beans 5 times each week. ? Heart-healthy fats. Healthy fats called Omega-3 fatty acids are found in foods such as flaxseeds and coldwater fish, like sardines, salmon, and mackerel.  Limit how much you eat of the following: ? Canned or prepackaged foods. ? Food that is high in trans fat, such as fried foods. ? Food that is high in saturated fat, such  as fatty meat. ? Sweets, desserts, sugary drinks, and other foods with added sugar. ? Full-fat dairy products.  Do not salt foods before eating.  Try to eat at least 2 vegetarian meals each week.  Eat more home-cooked food and less restaurant, buffet, and fast food.  When eating at a restaurant, ask that your food be prepared with less salt or no salt, if possible. What foods are recommended? The items listed may not be a complete list. Talk with your dietitian about what dietary choices are best for you. Grains Whole-grain or whole-wheat bread. Whole-grain or whole-wheat pasta. Brown rice. Orpah Cobb. Bulgur. Whole-grain  and low-sodium cereals. Pita bread. Low-fat, low-sodium crackers. Whole-wheat flour tortillas. Vegetables Fresh or frozen vegetables (raw, steamed, roasted, or grilled). Low-sodium or reduced-sodium tomato and vegetable juice. Low-sodium or reduced-sodium tomato sauce and tomato paste. Low-sodium or reduced-sodium canned vegetables. Fruits All fresh, dried, or frozen fruit. Canned fruit in natural juice (without added sugar). Meat and other protein foods Skinless chicken or [place]. Ground chicken or [place]. Pork with fat trimmed off. Fish and seafood. Egg whites. Dried beans, peas, or lentils. Unsalted nuts, nut butters, and seeds. Unsalted canned beans. Lean cuts of beef with fat trimmed off. Low-sodium, lean deli meat. Dairy Low-fat (1%) or fat-free (skim) milk. Fat-free, low-fat, or reduced-fat cheeses. Nonfat, low-sodium ricotta or cottage cheese. Low-fat or nonfat yogurt. Low-fat, low-sodium cheese. Fats and oils Soft margarine without trans fats. Vegetable oil. Low-fat, reduced-fat, or light mayonnaise and salad dressings (reduced-sodium). Canola, safflower, olive, soybean, and sunflower oils. Avocado. Seasoning and other foods Herbs. Spices. Seasoning mixes without salt. Unsalted popcorn and pretzels. Fat-free sweets. What foods are not recommended? The items listed may not be a complete list. Talk with your dietitian about what dietary choices are best for you. Grains Baked goods made with fat, such as croissants, muffins, or some breads. Dry pasta or rice meal packs. Vegetables Creamed or fried vegetables. Vegetables in a cheese sauce. Regular canned vegetables (not low-sodium or reduced-sodium). Regular canned tomato sauce and paste (not low-sodium or reduced-sodium). Regular tomato and vegetable juice (not low-sodium or reduced-sodium). Rosita Fire. Olives. Fruits Canned fruit in a light or heavy syrup. Fried fruit. Fruit in cream or butter sauce. Meat and other protein foods Fatty cuts  of meat. Ribs. Fried meat. Tomasa Blase. Sausage. Bologna and other processed lunch meats. Salami. Fatback. Hotdogs. Bratwurst. Salted nuts and seeds. Canned beans with added salt. Canned or smoked fish. Whole eggs or egg yolks. Chicken or [place] with skin. Dairy Whole or 2% milk, cream, and half-and-half. Whole or full-fat cream cheese. Whole-fat or sweetened yogurt. Full-fat cheese. Nondairy creamers. Whipped toppings. Processed cheese and cheese spreads. Fats and oils Butter. Stick margarine. Lard. Shortening. Ghee. Bacon fat. Tropical oils, such as coconut, palm kernel, or palm oil. Seasoning and other foods Salted popcorn and pretzels. Onion salt, garlic salt, seasoned salt, table salt, and sea salt. Worcestershire sauce. Tartar sauce. Barbecue sauce. Teriyaki sauce. Soy sauce, including reduced-sodium. Steak sauce. Canned and packaged gravies. Fish sauce. Oyster sauce. Cocktail sauce. Horseradish that you find on the shelf. Ketchup. Mustard. Meat flavorings and tenderizers. Bouillon cubes. Hot sauce and Tabasco sauce. Premade or packaged marinades. Premade or packaged taco seasonings. Relishes. Regular salad dressings. Where to find more information:  National Heart, Lung, and Blood Institute: PopSteam.is  American Heart Association: www.heart.org Summary  The DASH eating plan is a healthy eating plan that has been shown to reduce high blood pressure (hypertension). It may also reduce your risk  for type 2 diabetes, heart disease, and stroke.  With the DASH eating plan, you should limit salt (sodium) intake to 2,300 mg a day. If you have hypertension, you may need to reduce your sodium intake to 1,500 mg a day.  When on the DASH eating plan, aim to eat more fresh fruits and vegetables, whole grains, lean proteins, low-fat dairy, and heart-healthy fats.  Work with your health care provider or diet and nutrition specialist (dietitian) to adjust your eating plan to your individual calorie  needs. This information is not intended to replace advice given to you by your health care provider. Make sure you discuss any questions you have with your health care provider. Document Released: 07/07/2011 Document Revised: 07/11/2016 Document Reviewed: 07/11/2016 Elsevier Interactive Patient Education  2018 [employer].  Exercising to [employer] Exercising can help you to lose weight. In order to lose weight through exercise, you need to do vigorous-intensity exercise. You can tell that you are exercising with vigorous intensity if you are breathing very hard and fast and cannot hold a conversation while exercising. Moderate-intensity exercise helps to maintain your current weight. You can tell that you are exercising at a moderate level if you have a higher heart rate and faster breathing, but you are still able to hold a conversation. How often should I exercise? Choose an activity that you enjoy and set realistic goals. Your health care provider can help you to make an activity plan that works for you. Exercise regularly as directed by your health care provider. This may include:  Doing resistance training twice each week, such as: ? Push-ups. ? Sit-ups. ? Lifting weights. ? Using resistance bands.  Doing a given intensity of exercise for a given amount of time. Choose from these options: ? 150 minutes of moderate-intensity exercise every week. ? 75 minutes of vigorous-intensity exercise every week. ? A mix of moderate-intensity and vigorous-intensity exercise every week.  Children, pregnant women, people who are out of shape, people who are overweight, and older adults may need to consult a health care provider for individual recommendations. If you have any sort of medical condition, be sure to consult your health care provider before starting a new exercise program. What are some activities that can help me to lose weight?  Walking at a rate of at least 4.5 miles an  hour.  Jogging or running at a rate of 5 miles per hour.  Biking at a rate of at least 10 miles per hour.  Lap swimming.  Roller-skating or in-line skating.  Cross-country skiing.  Vigorous competitive sports, such as football, basketball, and soccer.  Jumping rope.  Aerobic dancing. How can I be more active in my day-to-day activities?  Use the stairs instead of the elevator.  Take a walk during your lunch break.  If you drive, park your car farther away from work or school.  If you take public transportation, get off one stop early and walk the rest of the way.  Make all of your phone calls while standing up and walking around.  Get up, stretch, and walk around every 30 minutes throughout the day. What guidelines should I follow while exercising?  Do not exercise so much that you hurt yourself, feel dizzy, or get very short of breath.  Consult your health care provider prior to starting a new exercise program.  Wear comfortable clothes and shoes with good support.  Drink plenty of water while you exercise to prevent dehydration or heat stroke. Body  water is lost during exercise and must be replaced.  Work out until you breathe faster and your heart beats faster. This information is not intended to replace advice given to you by your health care provider. Make sure you discuss any questions you have with your health care provider. Document Released: 08/20/2010 Document Revised: 12/24/2015 Document Reviewed: 12/19/2013 Elsevier Interactive Patient Education  2018 [employer].  IF you received an x-ray today, you will receive an invoice from [facility] Rad[facility]. Please contact [facility] at [phone] with questions or concerns regarding your invoice.   IF you received labwork today, you will receive an invoice from [place]. Please contact [facility] at [phone] with questions or concerns regarding your invoice.   Our billing staff will not be able to  assist you with questions regarding bills from these companies.  You will be contacted with the lab results as soon as they are available. The fastest way to get your results is to activate your My Chart account. Instructions are located on the last page of this paperwork. If you have not heard from [place] regarding the results in 2 weeks, please contact this office.

## 2018-04-06 NOTE — Progress Notes (Signed)
Brenda Christian  MRN: [id number] DOB: 1956-04-30  Subjective:  Brenda Christian is a 62 y.o. female seen in office today for a chief complaint of establishing care and medication refill. Pt was seeing Dr. Tamala Julian, who is no longer here. Pt is fasting today.   Chronic medical conditions: 1) essential hypertension: Started on meds ~10 years ago because she was taking daily advil and her doctor at the time did not want her to have increased bp. She now takes losartan 68m and toprol-xl 254mdaily. Checks bp daily. Range is [address]-875ystolically. No elevated readings. She has had her bp cuff compared to our in office readings last visit and it was similar to our readings.  In terms of diet, has more of a sweet tooth which she knows is her weakness.  She has changed her diet since July.  She is not bringing sweet things into the house.  Trying to eat salads and healthier food options.  For exercise, she likes doing the seated stairstepper machine. 2) hypothyroidism: Dx made ~10 years ago. Currently managed synthroid 1001mand cytomel 12.5mc65maily.  3) primary OA of both knees: "pretty good right now, not as bad as it can be." If it gets really bad, will take advil.  4) class III severe obesity: She does not check her weight but feels like she has lost some weight recently after making dietary changes. 5) typical atrial flutter: Takes toprol-xl 25mg66mly. Has not had issues since ED visit in 2018. Pulse is well within normal range.    Review of Systems  Constitutional: Negative for chills, diaphoresis and fever.  Respiratory: Negative for cough and shortness of breath.   Cardiovascular: Negative for chest pain, palpitations and leg swelling.  Gastrointestinal: Negative for abdominal pain, nausea and vomiting.  Endocrine: Negative for cold intolerance, heat intolerance, polydipsia, polyphagia and polyuria.  Genitourinary: Negative for hematuria.  Neurological: Negative for dizziness.     Patient Active Problem List   Diagnosis Date Noted  . Typical atrial flutter (HCC) [place]27/2018  . Hashimoto's thyroiditis 04/21/2017  . Primary osteoarthritis of both knees 04/21/2017  . Class 3 severe obesity due to excess calories without serious comorbidity with body mass index (BMI) of 40.0 to 44.9 in adult (HCC) [place]21/2018  . Essential hypertension 04/21/2017    Current Outpatient Medications on File Prior to Visit  Medication Sig Dispense Refill  . aspirin EC 325 MG tablet Take 1 tablet (325 mg total) by mouth daily. 30 tablet 0  . BLACK ELDERBERRY,BERRY-FLOWER, PO Take 1 tablet by mouth at bedtime.    . Glucosamine Sulfate 1000 MG CAPS Take 1 capsule by mouth 2 (two) times daily.     . levMarland Kitchenthyroxine (SYNTHROID, LEVOTHROID) 100 MCG tablet Take 1 tablet (100 mcg total) by mouth daily before breakfast. 90 tablet 3  . liothyronine (CYTOMEL) 25 MCG tablet Take 1 tablet (25 mcg total) by mouth daily. (Patient taking differently: Take 12.5 mcg by mouth daily. ) 90 tablet 3  . losartan (COZAAR) 25 MG tablet TAKE 1 TABLET BY MOUTH TWICE A DAY 180 tablet 1  . metoprolol succinate (TOPROL-XL) 25 MG 24 hr tablet Take 1 tablet (25 mg total) by mouth daily. 90 tablet 1  . NON FORMULARY ossopan MD    . Omega-3 Fatty Acids (FISH OIL) 1000 MG CAPS Take 1 capsule by mouth 2 (two) times daily.     . Probiotic Product (PROBIOTIC PO) Take 1 tablet by mouth daily.    . TURMERIC PO Take  2 tablets by mouth at bedtime.    . Vitamin D, Ergocalciferol, (DRISDOL) 50000 units CAPS capsule Take 50,000 Units by mouth every 7 (seven) days.     No current facility-administered medications on file prior to visit.     Allergies  Allergen Reactions  . Eggs Or Egg-Derived Products Other (See Comments)    Allergy test. Raw eggs   . Penicillins Swelling      Social History   Socioeconomic History  . Marital status: Single    Spouse name: Not on file  . Number of children: Not on file  . Years of  education: Not on file  . Highest education level: Not on file  Occupational History  . Not on file  Social Needs  . Financial resource strain: Not on file  . Food insecurity:    Worry: Not on file    Inability: Not on file  . Transportation needs:    Medical: Not on file    Non-medical: Not on file  Tobacco Use  . Smoking status: Never Smoker  . Smokeless tobacco: Never Used  Substance and Sexual Activity  . Alcohol use: Yes  . Drug use: No  . Sexual activity: Never  Lifestyle  . Physical activity:    Days per week: Not on file    Minutes per session: Not on file  . Stress: Not on file  Relationships  . Social connections:    Talks on phone: Not on file    Gets together: Not on file    Attends religious service: Not on file    Active member of club or organization: Not on file    Attends meetings of clubs or organizations: Not on file    Relationship status: Not on file  . Intimate partner violence:    Fear of current or ex partner: Not on file    Emotionally abused: Not on file    Physically abused: Not on file    Forced sexual activity: Not on file  Other Topics Concern  . 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       Tobacco: none      Alcohol:  Rarely      Drugs; none      Exercise:  Tai Chi two days per week on Mondays and Wednesdays    Objective:  BP (!) 160/82   Pulse 72   Temp 98 F (36.7 C) (Oral)   Resp 16   Ht 5' 3.58" (1.615 m)   SpO2 96%   Physical Exam  Constitutional: She is oriented to person, place, and time. She appears well-developed and well-nourished. No distress.  HENT:  Head: Normocephalic and atraumatic.  Mouth/Throat: Uvula is midline, oropharynx is clear and moist and mucous membranes are normal.  Eyes: Pupils are equal, round, and reactive to light. Conjunctivae and EOM are normal.   Neck: Normal range of motion.  Cardiovascular: Normal rate, regular rhythm, normal heart sounds and intact distal pulses.  Pulmonary/Chest: Effort normal and breath sounds normal. She has no wheezes. She has no rhonchi. She has no rales.  Musculoskeletal:       Right lower leg: She exhibits no swelling.       Left lower leg: She exhibits no swelling.  Neurological: She is  alert and oriented to person, place, and time.  Skin: Skin is warm and dry.  Psychiatric: She has a normal mood and affect.  Vitals reviewed.    BP Readings from Last 3 Encounters:  04/06/18 (!) 160/82  10/18/17 (!) 160/80  06/05/17 138/80   Wt Readings from Last 3 Encounters:  No data found for Wt     Assessment and Plan :  1. Essential hypertension Uncontrolled in office but patient did bring a typed document of her BP readings for the last 30 days.  All of her reported home blood pressure readings are well within normal range.  She has had this issue in the past of whitecoat syndrome.  Has had her blood pressure monitor compared to our in office monitor and it has had similar readings.  We will continue with current medication regimen at this time. - CBC with Differential/Platelet - Lipid panel - CMP14+EGFR - Urinalysis, dipstick only  2. Hashimoto's thyroiditis Labs pending.  Continue current medication regimen. - TSH - T3, Free - T4, Free  3. Class 3 severe obesity due to excess calories without serious comorbidity with body mass index (BMI) of 40.0 to 44.9 in adult [facility]) Patient declines having weight taken in office.  Discussed lifestyle modifications that will aid in weight loss.  Given education material on diet and exercise. 4. Typical atrial flutter (HCC) Normal heart rate and rhythm noted today.  Continue medications as prescribed.   Patient declines mammogram, Pap smear, and GI referral for colonoscopy. Meds ordered this encounter  Medications  . metoprolol succinate (TOPROL-XL) 25 MG 24  hr tablet    Sig: Take 1 tablet (25 mg total) by mouth daily.    Dispense:  90 tablet    Refill:  1    Order Specific Question:   Supervising Provider    Answer:   Horald Pollen [id number]  . liothyronine (CYTOMEL) 25 MCG tablet    Sig: Take 0.5 tablets (12.5 mcg total) by mouth daily.    Dispense:  90 tablet    Refill:  1    Order Specific Question:   Supervising Provider    Answer:   Horald Pollen [id number]  . levothyroxine (SYNTHROID, LEVOTHROID) 100 MCG tablet    Sig: Take 1 tablet (100 mcg total) by mouth daily before breakfast.    Dispense:  90 tablet    Refill:  1    Order Specific Question:   Supervising Provider    Answer:   Horald Pollen [id number]  . DISCONTD: losartan (COZAAR) 25 MG tablet    Sig: Take 1 tablet (25 mg total) by mouth 2 (two) times daily.    Dispense:  180 tablet    Refill:  1    Order Specific Question:   Supervising Provider    Answer:   Horald Pollen [id number]  . losartan (COZAAR) 25 MG tablet    Sig: Take 1 tablet (25 mg total) by mouth 2 (two) times daily.    Dispense:  180 tablet    Refill:  1    Order Specific Question:   Supervising Provider    Answer:   Horald Pollen [[id number]]    Tenna Delaine PA-C  [facility] [place] 04/06/2018 11:50 AM

## 2018-04-07 LAB — CBC WITH DIFFERENTIAL/PLATELET
BASOS ABS: 0 10*3/uL (ref 0.0–0.2)
Basos: 1 %
EOS (ABSOLUTE): 0.1 10*3/uL (ref 0.0–0.4)
Eos: 1 %
HEMATOCRIT: 45.5 % (ref 34.0–46.6)
HEMOGLOBIN: 16.2 g/dL — AB (ref 11.1–15.9)
Immature Grans (Abs): 0 10*3/uL (ref 0.0–0.1)
Immature Granulocytes: 0 %
LYMPHS: 49 %
Lymphocytes Absolute: 2.9 10*3/uL (ref 0.7–3.1)
MCH: 33.3 pg — ABNORMAL HIGH (ref 26.6–33.0)
MCHC: 35.6 g/dL (ref 31.5–35.7)
MCV: 94 fL (ref 79–97)
MONOCYTES: 8 %
Monocytes Absolute: 0.5 10*3/uL (ref 0.1–0.9)
Neutrophils Absolute: 2.4 10*3/uL (ref 1.4–7.0)
Neutrophils: 41 %
Platelets: 256 10*3/uL (ref 150–450)
RBC: 4.86 x10E6/uL (ref 3.77–5.28)
RDW: 14.4 % (ref 12.3–15.4)
WBC: 5.9 10*3/uL (ref 3.4–10.8)

## 2018-04-07 LAB — LIPID PANEL
CHOL/HDL RATIO: 2.7 ratio (ref 0.0–4.4)
Cholesterol, Total: 210 mg/dL — ABNORMAL HIGH (ref 100–199)
HDL: 79 mg/dL (ref >39)
LDL CALC: 105 mg/dL — AB (ref 0–99)
TRIGLYCERIDES: 128 mg/dL (ref 0–149)
VLDL Cholesterol Cal: 26 mg/dL (ref 5–40)

## 2018-04-07 LAB — CMP14+EGFR
ALK PHOS: 58 IU/L (ref 39–117)
ALT: 17 IU/L (ref 0–32)
AST: 16 IU/L (ref 0–40)
Albumin/Globulin Ratio: 1.6 (ref 1.2–2.2)
Albumin: 4.2 g/dL (ref 3.6–4.8)
BUN/Creatinine Ratio: 21 (ref 12–28)
BUN: 15 mg/dL (ref 8–27)
Bilirubin Total: 0.4 mg/dL (ref 0.0–1.2)
CALCIUM: 9.4 mg/dL (ref 8.7–10.3)
CO2: 22 mmol/L (ref 20–29)
CREATININE: 0.72 mg/dL (ref 0.57–1.00)
Chloride: 104 mmol/L (ref 96–106)
GFR calc Af Amer: 103 mL/min/{1.73_m2} (ref >59)
GFR, EST NON AFRICAN AMERICAN: 89 mL/min/{1.73_m2} (ref >59)
GLUCOSE: 90 mg/dL (ref 65–99)
Globulin, Total: 2.6 g/dL (ref 1.5–4.5)
Potassium: 4.5 mmol/L (ref 3.5–5.2)
Sodium: 143 mmol/L (ref 134–144)
Total Protein: 6.8 g/dL (ref 6.0–8.5)

## 2018-04-07 LAB — URINALYSIS, DIPSTICK ONLY
Bilirubin, UA: NEGATIVE
Glucose, UA: NEGATIVE
KETONES UA: NEGATIVE
Leukocytes, UA: NEGATIVE
NITRITE UA: NEGATIVE
Protein, UA: NEGATIVE
RBC UA: NEGATIVE
SPEC GRAV UA: 1.028 (ref 1.005–1.030)
UUROB: 0.2 mg/dL (ref 0.2–1.0)
pH, UA: 5.5 (ref 5.0–7.5)

## 2018-04-07 LAB — TSH: TSH: 1.49 u[IU]/mL (ref 0.450–4.500)

## 2018-04-07 LAB — T3, FREE: T3 FREE: 4.4 pg/mL (ref 2.0–4.4)

## 2018-04-07 LAB — T4, FREE: FREE T4: 1.07 ng/dL (ref 0.82–1.77)

## 2018-04-19 IMAGING — CR DG CHEST 2V
2 series · 2 of 2 positions shown · non-contrast
Comparison: 07/23/2016

CLINICAL DATA: Rapid heart rate

EXAM:
CHEST  2 VIEW

[chest lat]
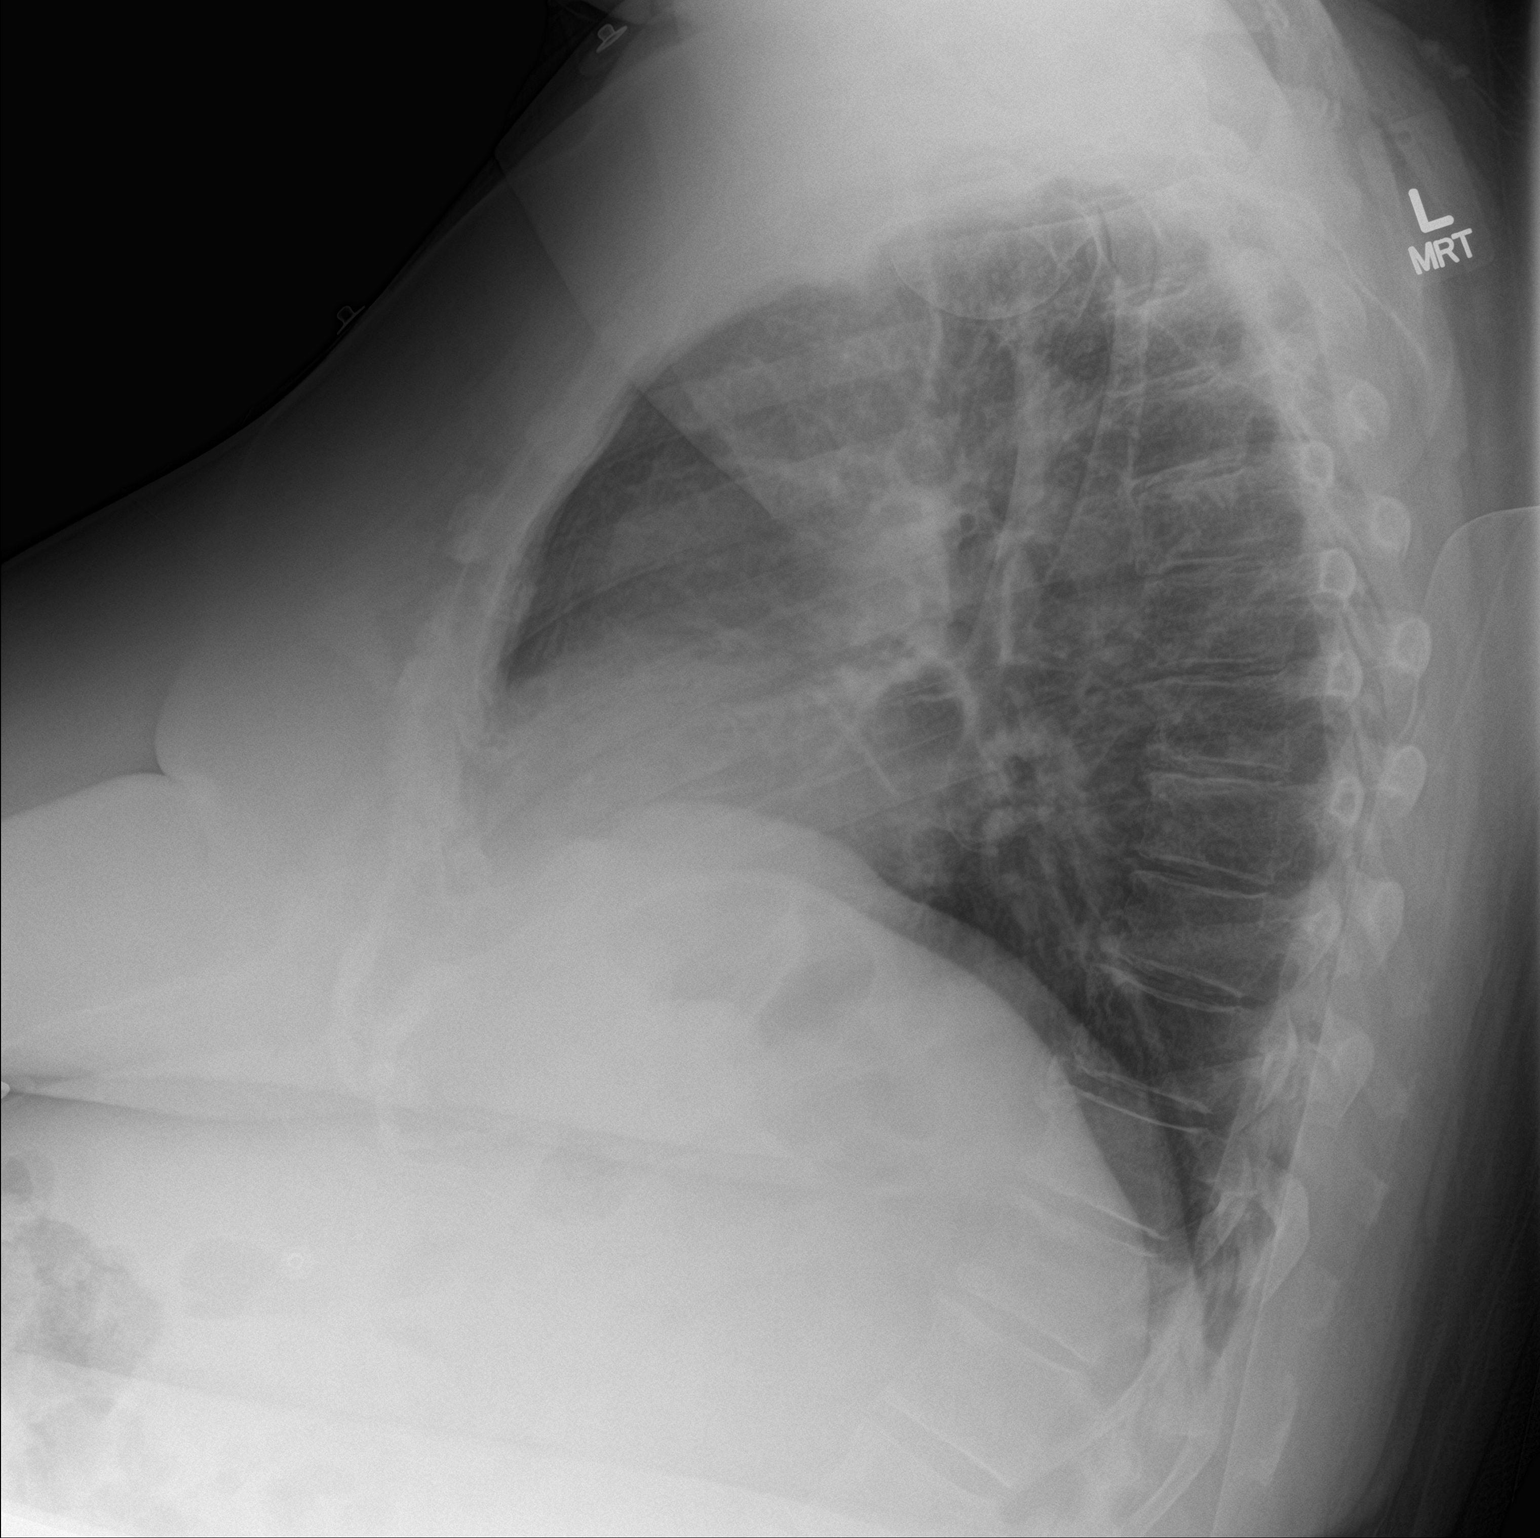

[chest ap]
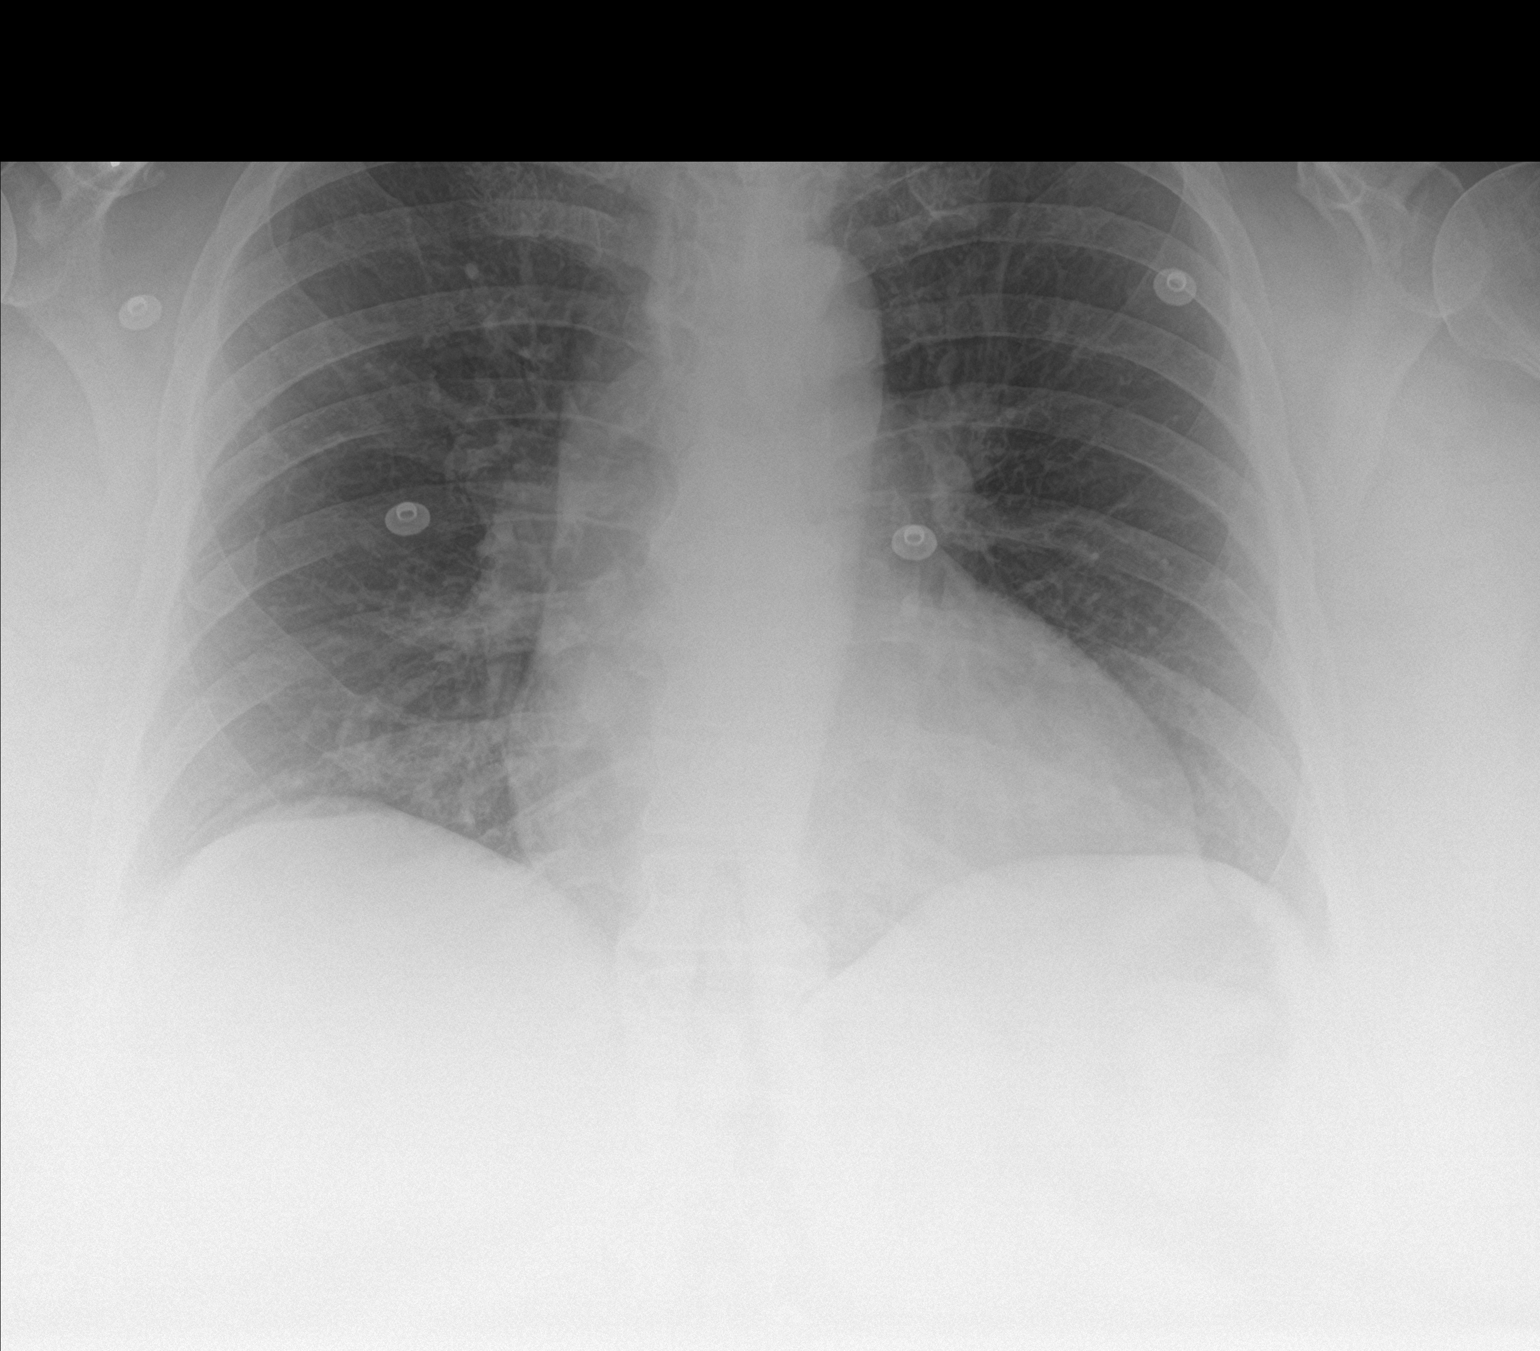

[2 of 2 positions shown; findings below may reference images not displayed]

FINDINGS: Mild cardiomegaly. No focal infiltrate or effusion. No pneumothorax.
IMPRESSION: Mild cardiomegaly.  Negative for edema or infiltrate.

## 2018-04-30 ENCOUNTER — Encounter: Payer: BLUE CROSS/BLUE SHIELD | Admitting: Family Medicine

## 2018-07-05 ENCOUNTER — Other Ambulatory Visit: Payer: Self-pay | Admitting: Physician Assistant

## 2018-07-05 NOTE — Telephone Encounter (Signed)
Spoke with pt who states she still has scrip from [facility] and has meds left as well. States not needed.

## 2018-07-05 NOTE — Telephone Encounter (Signed)
Copied from CRM [id number]. Topic: Quick Communication - Rx Refill/Question >> Jul 05, 2018 11:59 AM Jilda Rocheemaray, Melissa wrote: Medication: metoprolol succinate (TOPROL-XL) 25 MG 24 hr tablet   Has the patient contacted their pharmacy? Yes.   (Agent: If no, request that the patient contact the pharmacy for the refill.) (Agent: If yes, when and what did the pharmacy advise?) Pharmacy called for a refill for patient  Preferred Pharmacy (with phone number or street name): [id number] [place] - [address] [phone] (Phone) [phone] (Fax)    Agent: Please be advised that RX refills may take up to 3 business days. We ask that you follow-up with your pharmacy.

## 2018-10-01 DIAGNOSIS — E039 Hypothyroidism, unspecified: Secondary | ICD-10-CM | POA: Diagnosis not present

## 2018-10-01 DIAGNOSIS — I10 Essential (primary) hypertension: Secondary | ICD-10-CM | POA: Diagnosis not present

## 2018-10-01 DIAGNOSIS — M6530 Trigger finger, unspecified finger: Secondary | ICD-10-CM | POA: Diagnosis not present

## 2018-12-23 ENCOUNTER — Other Ambulatory Visit: Payer: Self-pay | Admitting: Physician Assistant

## 2018-12-23 NOTE — Telephone Encounter (Signed)
Requested medication (s) are due for refill today: Yes  Requested medication (s) are on the active medication list: Yes  Last refill:  04/06/18  Future visit scheduled: No  Notes to clinic:  PCP no longer at practice.    Requested Prescriptions  Pending Prescriptions Disp Refills   metoprolol succinate (TOPROL-XL) 25 MG 24 hr tablet [Pharmacy Med Name: METOPROLOL SUCC ER 25 MG TAB] 90 tablet 1    Sig: TAKE 1 TABLET BY MOUTH EVERY DAY     Cardiovascular:  Beta Blockers Failed - 12/23/2018  1:01 AM      Failed - Last BP in normal range    BP Readings from Last 1 Encounters:  04/06/18 (!) 160/82         Failed - Valid encounter within last 6 months    Recent Outpatient Visits          8 months ago Essential hypertension   [facility] at [place], [place] D, PA-C   1 year ago Essential hypertension   [facility] at [facility], Myrle Sheng, MD   1 year ago Typical atrial flutter [facility])   Primary Care at [facility], Myrle Sheng, MD   1 year ago Racing heart beat   Primary Care at [facility], [place], [place]   1 year ago Routine physical examination   Primary [facility] at [facility], Myrle Sheng, MD             Passed - Last Heart Rate in normal range    Pulse Readings from Last 1 Encounters:  04/06/18 72

## 2019-04-05 DIAGNOSIS — Z8249 Family history of ischemic heart disease and other diseases of the circulatory system: Secondary | ICD-10-CM | POA: Diagnosis not present

## 2019-04-05 DIAGNOSIS — E039 Hypothyroidism, unspecified: Secondary | ICD-10-CM | POA: Diagnosis not present

## 2019-04-05 DIAGNOSIS — I10 Essential (primary) hypertension: Secondary | ICD-10-CM | POA: Diagnosis not present

## 2019-04-05 DIAGNOSIS — R600 Localized edema: Secondary | ICD-10-CM | POA: Diagnosis not present

## 2019-06-18 DIAGNOSIS — Z23 Encounter for immunization: Secondary | ICD-10-CM | POA: Diagnosis not present

## 2019-10-04 DIAGNOSIS — I10 Essential (primary) hypertension: Secondary | ICD-10-CM | POA: Diagnosis not present

## 2019-10-04 DIAGNOSIS — E039 Hypothyroidism, unspecified: Secondary | ICD-10-CM | POA: Diagnosis not present

## 2019-10-16 ENCOUNTER — Ambulatory Visit: Payer: BC Managed Care – PPO | Attending: Internal Medicine

## 2019-10-16 DIAGNOSIS — Z23 Encounter for immunization: Secondary | ICD-10-CM

## 2019-10-16 NOTE — Progress Notes (Signed)
   Covid-19 Vaccination Clinic  Name:  Erza Mothershead    MRN: [id number] DOB: 1956/04/11  10/16/2019  Ms. Limb was observed post Covid-19 immunization for 15 minutes without incident. She was provided with Vaccine Information Sheet and instruction to access the V-Safe system.   Ms. Detzel was instructed to call 911 with any severe reactions post vaccine: Marland Kitchen Difficulty breathing  . Swelling of face and throat  . A fast heartbeat  . A bad rash all over body  . Dizziness and weakness   Immunizations Administered    Name Date Dose VIS Date Route   Pfizer COVID-19 Vaccine 10/16/2019  1:30 PM 0.3 mL 07/12/2019 Intramuscular   Manufacturer: [employer], [employer]   Lot: [id number]   NDC: [address]-8

## 2019-11-06 ENCOUNTER — Ambulatory Visit: Payer: BC Managed Care – PPO | Attending: Internal Medicine

## 2019-11-06 DIAGNOSIS — Z23 Encounter for immunization: Secondary | ICD-10-CM

## 2019-11-06 NOTE — Progress Notes (Signed)
   Covid-19 Vaccination Clinic  Name:  Brenda Christian    MRN: [id number] DOB: September 19, 1955  11/06/2019  [name] was observed post Covid-19 immunization for 15 minutes without incident. She was provided with Vaccine Information Sheet and instruction to access the V-Safe system.   [name] was instructed to call 911 with any severe reactions post vaccine: Marland Kitchen Difficulty breathing  . Swelling of face and throat  . A fast heartbeat  . A bad rash all over body  . Dizziness and weakness   Immunizations Administered    Name Date Dose VIS Date Route   Pfizer COVID-19 Vaccine 11/06/2019 10:40 AM 0.3 mL 07/12/2019 Intramuscular   Manufacturer: [employer], [employer]   Lot: [id number]   NDC: [address]-3

## 2020-05-09 DIAGNOSIS — Z20822 Contact with and (suspected) exposure to covid-19: Secondary | ICD-10-CM | POA: Diagnosis not present

## 2020-09-10 DIAGNOSIS — H524 Presbyopia: Secondary | ICD-10-CM | POA: Diagnosis not present

## 2020-09-10 DIAGNOSIS — H40213 Acute angle-closure glaucoma, bilateral: Secondary | ICD-10-CM | POA: Diagnosis not present

## 2020-09-10 DIAGNOSIS — H5203 Hypermetropia, bilateral: Secondary | ICD-10-CM | POA: Diagnosis not present

## 2020-09-10 DIAGNOSIS — H52223 Regular astigmatism, bilateral: Secondary | ICD-10-CM | POA: Diagnosis not present

## 2020-10-07 DIAGNOSIS — I10 Essential (primary) hypertension: Secondary | ICD-10-CM | POA: Diagnosis not present

## 2020-10-07 DIAGNOSIS — Z1211 Encounter for screening for malignant neoplasm of colon: Secondary | ICD-10-CM | POA: Diagnosis not present

## 2020-10-07 DIAGNOSIS — L729 Follicular cyst of the skin and subcutaneous tissue, unspecified: Secondary | ICD-10-CM | POA: Diagnosis not present

## 2020-10-07 DIAGNOSIS — Z1159 Encounter for screening for other viral diseases: Secondary | ICD-10-CM | POA: Diagnosis not present

## 2020-10-07 DIAGNOSIS — Z0000 Encounter for general adult medical examination without abnormal findings: Secondary | ICD-10-CM | POA: Diagnosis not present

## 2020-10-07 DIAGNOSIS — M720 Palmar fascial fibromatosis [Dupuytren]: Secondary | ICD-10-CM | POA: Diagnosis not present

## 2020-10-07 DIAGNOSIS — E039 Hypothyroidism, unspecified: Secondary | ICD-10-CM | POA: Diagnosis not present

## 2020-10-07 DIAGNOSIS — Z1389 Encounter for screening for other disorder: Secondary | ICD-10-CM | POA: Diagnosis not present

## 2021-10-15 DIAGNOSIS — Z0000 Encounter for general adult medical examination without abnormal findings: Secondary | ICD-10-CM | POA: Diagnosis not present

## 2021-10-15 DIAGNOSIS — E039 Hypothyroidism, unspecified: Secondary | ICD-10-CM | POA: Diagnosis not present

## 2021-10-15 DIAGNOSIS — I10 Essential (primary) hypertension: Secondary | ICD-10-CM | POA: Diagnosis not present

## 2021-10-15 DIAGNOSIS — Z23 Encounter for immunization: Secondary | ICD-10-CM | POA: Diagnosis not present

## 2021-10-15 DIAGNOSIS — Z1389 Encounter for screening for other disorder: Secondary | ICD-10-CM | POA: Diagnosis not present

## 2021-11-11 DIAGNOSIS — J208 Acute bronchitis due to other specified organisms: Secondary | ICD-10-CM | POA: Diagnosis not present

## 2022-04-19 DIAGNOSIS — U071 COVID-19: Secondary | ICD-10-CM | POA: Diagnosis not present

## 2022-11-16 DIAGNOSIS — I10 Essential (primary) hypertension: Secondary | ICD-10-CM | POA: Diagnosis not present

## 2022-11-16 DIAGNOSIS — Z0000 Encounter for general adult medical examination without abnormal findings: Secondary | ICD-10-CM | POA: Diagnosis not present

## 2022-11-16 DIAGNOSIS — E039 Hypothyroidism, unspecified: Secondary | ICD-10-CM | POA: Diagnosis not present

## 2022-11-16 DIAGNOSIS — Z1331 Encounter for screening for depression: Secondary | ICD-10-CM | POA: Diagnosis not present

## 2022-12-19 DIAGNOSIS — M1A0720 Idiopathic chronic gout, left ankle and foot, without tophus (tophi): Secondary | ICD-10-CM | POA: Diagnosis not present

## 2023-12-29 DIAGNOSIS — I10 Essential (primary) hypertension: Secondary | ICD-10-CM | POA: Diagnosis not present

## 2023-12-29 DIAGNOSIS — Z1331 Encounter for screening for depression: Secondary | ICD-10-CM | POA: Diagnosis not present

## 2023-12-29 DIAGNOSIS — N950 Postmenopausal bleeding: Secondary | ICD-10-CM | POA: Diagnosis not present

## 2023-12-29 DIAGNOSIS — M79671 Pain in right foot: Secondary | ICD-10-CM | POA: Diagnosis not present

## 2023-12-29 DIAGNOSIS — E039 Hypothyroidism, unspecified: Secondary | ICD-10-CM | POA: Diagnosis not present

## 2023-12-29 DIAGNOSIS — Z0000 Encounter for general adult medical examination without abnormal findings: Secondary | ICD-10-CM | POA: Diagnosis not present

## 2024-01-13 DIAGNOSIS — J988 Other specified respiratory disorders: Secondary | ICD-10-CM | POA: Diagnosis not present

## 2024-03-21 ENCOUNTER — Other Ambulatory Visit (HOSPITAL_COMMUNITY)
Admission: RE | Admit: 2024-03-21 | Discharge: 2024-03-21 | Disposition: A | Discharge status: Home / Self Care | Source: Ambulatory Visit | Attending: Obstetrics and Gynecology | Admitting: Obstetrics and Gynecology

## 2024-03-21 ENCOUNTER — Other Ambulatory Visit: Payer: Self-pay | Admitting: Obstetrics and Gynecology

## 2024-03-21 DIAGNOSIS — Z01419 Encounter for gynecological examination (general) (routine) without abnormal findings: Secondary | ICD-10-CM | POA: Diagnosis not present

## 2024-03-21 DIAGNOSIS — Z1151 Encounter for screening for human papillomavirus (HPV): Secondary | ICD-10-CM | POA: Insufficient documentation

## 2024-03-21 DIAGNOSIS — N950 Postmenopausal bleeding: Secondary | ICD-10-CM | POA: Diagnosis not present

## 2024-03-21 DIAGNOSIS — Z124 Encounter for screening for malignant neoplasm of cervix: Secondary | ICD-10-CM | POA: Insufficient documentation

## 2024-03-26 LAB — CYTOLOGY - PAP
Comment: NEGATIVE
Diagnosis: NEGATIVE
Diagnosis: REACTIVE
High risk HPV: NEGATIVE

## 2024-04-15 DIAGNOSIS — N8502 Endometrial intraepithelial neoplasia [EIN]: Secondary | ICD-10-CM | POA: Diagnosis not present

## 2024-04-15 DIAGNOSIS — N950 Postmenopausal bleeding: Secondary | ICD-10-CM | POA: Diagnosis not present

## 2024-04-15 DIAGNOSIS — N83202 Unspecified ovarian cyst, left side: Secondary | ICD-10-CM | POA: Diagnosis not present

## 2024-04-15 DIAGNOSIS — R9389 Abnormal findings on diagnostic imaging of other specified body structures: Secondary | ICD-10-CM | POA: Diagnosis not present

## 2024-04-15 DIAGNOSIS — C541 Malignant neoplasm of endometrium: Secondary | ICD-10-CM | POA: Diagnosis not present

## 2024-04-23 ENCOUNTER — Telehealth: Payer: Self-pay | Admitting: *Deleted

## 2024-04-23 NOTE — Telephone Encounter (Signed)
 Spoke with the patient regarding the referral to GYN oncology. Patient scheduled as new patient with Dr Viktoria on 10/3 t 9:45 am. Patient given an arrival time of 9:15 am.  Explained to the patient the the doctor will perform a pelvic exam at this visit. Patient given the policy that only one visitor allowed and that visitor must be over 16 yrs are allowed in the Cancer Center. Patient given the address/phone number for the clinic and that the center offers free valet service. Patient aware that masks optional.

## 2024-05-01 ENCOUNTER — Encounter: Payer: Self-pay | Admitting: Gynecologic Oncology

## 2024-05-01 NOTE — Progress Notes (Unsigned)
 GYNECOLOGIC ONCOLOGY NEW PATIENT CONSULTATION   Patient Name: Brenda Christian  Patient Age: 68 y.o. 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     We discussed that the standard of care for a patient with EIN who is a suitable candidate for surgery is total hysterectomy.  The patient is a suitable candidate for hysterectomy via a minimally invasive approach to surgery.  Given that she is postmenopausal, a bilateral salpingo-oophorectomy is also recommended.  We reviewed that robotic assistance would be used to complete the surgery.  We discussed that endometrial cancer is detected in about 40% of final uterine pathology specimens from patients with EIN.  I reviewed 3 different options.  The first would be to send the uterus for intraoperative frozen pathology.  If cancer is identified at the time of surgery, additional procedures including lymph node evaluation, is recommended.  The second option would be to inject ICG and perform sentinel lymph node mapping but to still send the uterus for frozen section to guide the need for removal of sentinel lymph nodes.  The third option would be to proceed with sentinel lymph node mapping and biopsy without frozen section.  After discussing the risks and benefits of each of these options, given biopsy showing at least EIN, patient would like to move forward with ICG injection and sentinel lymph node removal.   In terms of additional treatment, we reviewed the role of progesterone therapy and the effect on preneoplastic lesions, believed to include induction of apoptosis in addition to tissue sloughing during  withdrawal bleeding.  Activation of the progesterone receptors is believed to lead stromal decidualization and thinning of the lining.  We reviewed the 3 most studied options, to include levonorgesterol IUD (20mcg/d), oral medorxyprogesterone acetate , or oral megesterol acetate.  With progesterone therapy, we discussed the need for close surveillance and endometrial biopsies.  We discussed that if she does not tolerate Trendelenburg either at the start of surgery or after insufflation, backup plan would be for hysteroscopy with endometrial sampling and Mirena IUD placement.  Goal would then be to work towards additional weight loss so that definitive surgery could be performed.  We reviewed the sentinel lymph node technique. Risks and benefits of sentinel lymph node biopsy was reviewed. We reviewed the technique and ICG dye. The patient DOES NOT have an iodine allergy or known liver dysfunction. We reviewed the false negative rate (0.4%), and that 3% of patients with metastatic disease will not have it detected by SLN biopsy in endometrial cancer. A low risk of allergic reaction to the dye, <0.2% for ICG, has been reported. We also discussed that in the case of failed mapping, which occurs 40% of the time, a bilateral or unilateral lymphadenectomy will be performed at the surgeon's discretion.   Potential benefits of sentinel nodes including a higher detection rate for metastasis due to ultrastaging and potential reduction in operative morbidity. However, there remains uncertainty as to the role for treatment of micrometastatic disease. Further, the benefit of operative morbidity associated with the SLN technique in endometrial cancer is not yet completely known. In other patient populations (e.g. the cervical cancer population) there has been observed reductions in morbidity with SLN biopsy compared  to pelvic lymphadenectomy. Lymphedema, nerve dysfunction and lymphocysts are all potential risks with the SLN  technique as with complete lymphadenectomy. Additional risks to the patient include the risk of damage to an internal organ while operating in an altered view (e.g. the black and white image of the robotic fluorescence imaging mode).   We discussed the plan for a robotic assisted hysterectomy, bilateral salpingo-oophorectomy, possible sentinel lymph node evaluation, possible lymph node dissection, possible laparotomy, possible hysteroscopy with D&C and Mirena IUD placement. The risks of surgery were discussed in detail and she understands these to include infection; wound separation; hernia; vaginal cuff separation, injury to adjacent organs such as bowel, bladder, blood vessels, ureters and nerves; bleeding which may require blood transfusion; anesthesia risk; thromboembolic events; possible death; unforeseen complications; possible need for re-exploration; medical complications such as heart attack, stroke, pleural effusion and pneumonia; and, if full lymphadenectomy is performed the risk of lymphedema and lymphocyst. The patient will receive DVT and antibiotic prophylaxis as indicated. She voiced a clear understanding. She had the opportunity to ask questions. Perioperative instructions were reviewed with her. Prescriptions for post-op medications were sent to her pharmacy of choice.  Given her desire to delay surgery until November, discussed recommendation to start progesterone both to help decrease/stop bleeding but also for treatment of her EIN.  She was given a handout regarding Provera.  The office will reach out to her PCP for surgical clearance.  Given her age and family history, I encouraged her to consider colon cancer screening.  A copy of this note was sent to the patient's referring provider.   75 minutes of total time was spent for this patient encounter, including preparation, face-to-face counseling with the patient and coordination of care, and documentation of the  encounter.  Comer Dollar, MD  Division of Gynecologic Oncology  Department of Obstetrics and Gynecology  [facility]  ___________________________________________  Chief Complaint: Chief Complaint  Patient presents with   EIN    History of Present Illness:  Bree Heinzelman is a 68 y.o. y.o. female who is seen in consultation at the request of Dr. Rosalva for an evaluation of EIN.  Patient initially presented with postmenopausal bleeding.  This for started in 2024 and was quite sporadic initially.  She was evaluated in August 2025.  Office ultrasound on 9/15 revealed a uterus measuring 7.3 x 3.2 x 4.2 cm with an endometrial lining measuring 12.2 mm.  Left ovary contained a simple cyst measuring 4.1 cm and another measuring 1.2 cm.  Both avascular.  Right ovary not visualized. 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  Patient presents today by herself.  She notes first episode of postmenopausal bleeding in 2024 lasting 3 days.  In May of this year, she began bleeding intermittently.  This will be bleeding heavily at times for up to 8 days using 3 overnight pads which are saturated.  She will then have episodes of spotting for up to 12 days, and other periods of no bleeding at all.  She has some cramping at the start of bleeding episodes, responds to Advil.  She has not had any bleeding since 9/20 after her biopsy.  She endorses a good appetite.  Has currently been on an eating plant since 2020 and has lost about 60 pounds overall.  Yesterday was the first day she had weighed herself since 2020.  She denies any nausea or emesis.  Reports normal bowel function, takes a daily probiotic.  Has noticed some increased frequency as well as urgency with age, denies any other urinary symptoms.  Has had some increased fatigue over the last several months.  PAST MEDICAL HISTORY:  Past Medical History:  Diagnosis Date   Allergy     cats   Arthritis    knees   Asthma    allergy related   Depression    resolved   Thyroid  disease      PAST SURGICAL HISTORY:  Past Surgical History:  Procedure Laterality Date   OVARIAN CYST SURGERY  1995   TONSILLECTOMY     WISDOM TOOTH EXTRACTION      OB/GYN HISTORY:  OB History  Gravida Para Term Preterm AB Living  0 0 0 0 0 0  SAB IAB Ectopic Multiple Live Births  0 0 0 0 0    No LMP recorded. Patient is postmenopausal.  Age at menarche: 62  Age at menopause: 81 Hx of HRT: denies Hx of STDs: denies Last pap: 03/2024 - NILM, HR HPV negative History of abnormal pap smears: denies  SCREENING STUDIES:  Last mammogram: in her 30s  Last colonoscopy: has not had  MEDICATIONS: Outpatient Encounter Medications as of 05/03/2024  Medication Sig   ascorbic acid (VITAMIN C) 1000 MG tablet Take 1,000 mg by mouth daily.   BLACK ELDERBERRY,BERRY-FLOWER, PO Take 1 tablet by mouth at bedtime.   Cholecalciferol 25 MCG (1000 UT) CHEW as directed Orally Once a day   Glucosamine Sulfate 1000 MG CAPS Take 1 capsule by mouth 2 (two) times daily.    levothyroxine  (SYNTHROID , LEVOTHROID) 100 MCG tablet Take 1 tablet (100 mcg total) by mouth daily before breakfast.   liothyronine  (CYTOMEL ) 25 MCG tablet Take 0.5 tablets (12.5 mcg total) by mouth daily.   losartan  (COZAAR ) 25 MG tablet Take 1 tablet (25 mg total) by mouth 2 (two) times daily.   Magnesium 200 MG TABS 2 tablets with a meal Orally Once a day   medroxyPROGESTERone (PROVERA) 10 MG tablet Take 1 tablet (10 mg total) by mouth daily.   NON FORMULARY 1 tablet daily. Immuno 150 multivitamin   NON FORMULARY 1 Dose daily. Vinia Red Grape powder   Omega-3 Fatty Acids (FISH OIL) 1000 MG CAPS Take 1 capsule by mouth 2 (two) times daily.    Probiotic Product (PROBIOTIC PO) Take 1 tablet by mouth daily.   senna-docusate (SENOKOT-S) 8.6-50 MG tablet Take 2 tablets by mouth at bedtime. For AFTER surgery, do not take if having diarrhea    traMADol (ULTRAM) 50 MG tablet Take 1 tablet (50 mg total) by mouth every 6 (six) hours as needed for moderate pain (pain score 4-6). For AFTER surgery only, do not take and drive   TURMERIC PO Take 2 tablets by mouth at bedtime.   Vitamin D , Ergocalciferol , (DRISDOL) 50000 units CAPS capsule Take 50,000 Units by mouth every 7 (seven) days.   [DISCONTINUED] aspirin  EC 325 MG tablet Take 1 tablet (325 mg total) by mouth daily. (Patient not taking: Reported on 05/01/2024)   [DISCONTINUED] metoprolol  succinate (TOPROL -XL) 25 MG 24 hr tablet Take 1 tablet (25 mg total) by mouth daily. (Patient not taking: Reported on 05/01/2024)   [DISCONTINUED] NON FORMULARY ossopan MD (Patient not taking: Reported on 05/01/2024)   No facility-administered encounter medications on file as of 05/03/2024.    ALLERGIES:  Allergies  Allergen Reactions   Egg-Derived Products Other (See Comments)    Allergy test. Raw eggs    Penicillins Swelling   Wound Dressing  Adhesive Dermatitis     FAMILY HISTORY:  Family History  Problem Relation Age of Onset   Heart disease Mother    Diabetes Mother    Stroke Mother 10       CVA x 2   Hyperlipidemia Father    Stroke Maternal Grandmother    Mental retardation Maternal Grandmother    Cervical cancer Maternal Grandmother    Heart disease Maternal Grandfather    Cancer Paternal Grandmother    Cancer Paternal Grandfather    Heart disease Paternal Grandfather    Colon cancer Paternal Grandfather    Breast cancer Neg Hx    Prostate cancer Neg Hx    Ovarian cancer Neg Hx    Pancreatic cancer Neg Hx      SOCIAL HISTORY:  Social Connections: Not on file    REVIEW OF SYSTEMS:  + bleeding, fatigue, discharge Denies appetite changes, fevers, chills, unexplained weight changes. Denies hearing loss, neck lumps or masses, mouth sores, ringing in ears or voice changes. Denies cough or wheezing.  Denies shortness of breath. Denies chest pain or palpitations. Denies leg  swelling. Denies abdominal distention, pain, blood in stools, constipation, diarrhea, nausea, vomiting, or early satiety. Denies pain with intercourse, dysuria, frequency, hematuria or incontinence. Denies hot flashes, pelvic pain.   Denies joint pain, back pain or muscle pain/cramps. Denies itching, rash, or wounds. Denies dizziness, headaches, numbness or seizures. Denies swollen lymph nodes or glands, denies easy bruising or bleeding. Denies anxiety, depression, confusion, or decreased concentration.  Physical Exam:  Vital Signs for this encounter:  Blood pressure (!) 119/47, pulse 63, temperature 98.4 F (36.9 C), temperature source Oral, resp. rate 19, height 5' 2 (1.575 m), weight 272 lb 12.8 oz (123.7 kg), SpO2 99%. Body mass index is 49.9 kg/m. General: Alert, oriented, no acute distress.  HEENT: Normocephalic, atraumatic. Sclera anicteric.  Chest: Clear to auscultation bilaterally. No wheezes, rhonchi, or rales. Cardiovascular: Regular rate and rhythm, no murmurs, rubs, or gallops.  Abdomen: Obese. Normoactive bowel sounds. Soft, nondistended, nontender to palpation. No masses or hepatosplenomegaly appreciated. No palpable fluid wave.  Extremities: Grossly normal range of motion. Warm, well perfused. No edema bilaterally.  Skin: No rashes or lesions.  Lymphatics: No cervical, supraclavicular, or inguinal adenopathy.  GU:  Normal external female genitalia. No lesions. No discharge or bleeding.             Bladder/urethra:  No lesions or masses, well supported bladder             Vagina: Well-rugated, no lesions.             Cervix: Normal appearing, no lesions.             Uterus: Exam somewhat limited by body habitus, uterus is small, mobile, no parametrial involvement or nodularity.             Adnexa: No masses palpated.  Rectal: Deferred.  LABORATORY AND RADIOLOGIC DATA:  Outside medical records were reviewed to synthesize the above history, along with the history and  physical obtained during the visit.   Lab Results  Component Value Date   WBC 5.9 04/06/2018   HGB 16.2 (H) 04/06/2018   HCT 45.5 04/06/2018   PLT 256 04/06/2018   GLUCOSE 90 04/06/2018   CHOL 210 (H) 04/06/2018   TRIG 128 04/06/2018   HDL 79 04/06/2018   LDLCALC 105 (H) 04/06/2018   ALT 17 04/06/2018   AST 16 04/06/2018   NA 143 04/06/2018  K 4.5 04/06/2018   CL 104 04/06/2018   CREATININE 0.72 04/06/2018   BUN 15 04/06/2018   CO2 22 04/06/2018   TSH 1.490 04/06/2018   HGBA1C 5.3 04/18/2017

## 2024-05-03 ENCOUNTER — Inpatient Hospital Stay: Attending: Gynecologic Oncology | Admitting: Gynecologic Oncology

## 2024-05-03 ENCOUNTER — Encounter: Payer: Self-pay | Admitting: Gynecologic Oncology

## 2024-05-03 ENCOUNTER — Telehealth: Payer: Self-pay | Admitting: *Deleted

## 2024-05-03 ENCOUNTER — Inpatient Hospital Stay: Admitting: Gynecologic Oncology

## 2024-05-03 VITALS — BP 119/47 | HR 63 | Temp 98.4°F | Resp 19 | Ht 62.0 in | Wt 272.8 lb

## 2024-05-03 DIAGNOSIS — E66813 Obesity, class 3: Secondary | ICD-10-CM | POA: Insufficient documentation

## 2024-05-03 DIAGNOSIS — Z6841 Body Mass Index (BMI) 40.0 and over, adult: Secondary | ICD-10-CM | POA: Diagnosis not present

## 2024-05-03 DIAGNOSIS — I10 Essential (primary) hypertension: Secondary | ICD-10-CM | POA: Insufficient documentation

## 2024-05-03 DIAGNOSIS — E079 Disorder of thyroid, unspecified: Secondary | ICD-10-CM | POA: Diagnosis not present

## 2024-05-03 DIAGNOSIS — Z79890 Hormone replacement therapy (postmenopausal): Secondary | ICD-10-CM | POA: Diagnosis not present

## 2024-05-03 DIAGNOSIS — Z8049 Family history of malignant neoplasm of other genital organs: Secondary | ICD-10-CM | POA: Insufficient documentation

## 2024-05-03 DIAGNOSIS — R5383 Other fatigue: Secondary | ICD-10-CM | POA: Diagnosis not present

## 2024-05-03 DIAGNOSIS — M1990 Unspecified osteoarthritis, unspecified site: Secondary | ICD-10-CM | POA: Diagnosis not present

## 2024-05-03 DIAGNOSIS — Z79899 Other long term (current) drug therapy: Secondary | ICD-10-CM | POA: Insufficient documentation

## 2024-05-03 DIAGNOSIS — R3915 Urgency of urination: Secondary | ICD-10-CM | POA: Insufficient documentation

## 2024-05-03 DIAGNOSIS — Z800 Family history of malignant neoplasm of digestive organs: Secondary | ICD-10-CM | POA: Diagnosis not present

## 2024-05-03 DIAGNOSIS — Z809 Family history of malignant neoplasm, unspecified: Secondary | ICD-10-CM | POA: Diagnosis not present

## 2024-05-03 DIAGNOSIS — J45909 Unspecified asthma, uncomplicated: Secondary | ICD-10-CM | POA: Insufficient documentation

## 2024-05-03 DIAGNOSIS — N8502 Endometrial intraepithelial neoplasia [EIN]: Secondary | ICD-10-CM | POA: Diagnosis not present

## 2024-05-03 DIAGNOSIS — N950 Postmenopausal bleeding: Secondary | ICD-10-CM | POA: Insufficient documentation

## 2024-05-03 MED ORDER — TRAMADOL HCL 50 MG PO TABS: 50.0000 mg | ORAL_TABLET | Freq: Four times a day (QID) | ORAL | 0 refills | Status: AC | PRN

## 2024-05-03 MED ORDER — SENNOSIDES-DOCUSATE SODIUM 8.6-50 MG PO TABS: 2.0000 | ORAL_TABLET | Freq: Every day | ORAL | 0 refills | Status: AC

## 2024-05-03 MED ORDER — MEDROXYPROGESTERONE ACETATE 10 MG PO TABS: 10.0000 mg | ORAL_TABLET | Freq: Every day | ORAL | 1 refills | Status: DC

## 2024-05-03 NOTE — Telephone Encounter (Signed)
 Per Dr Viktoria fax records and surgical optimization form to the patient's PCP office (Dr Austin [phone])

## 2024-05-03 NOTE — Patient Instructions (Signed)
 Dr. Viktoria has sent in provera to start taking now. See handout for further information.  Preparing for your Surgery  Plan for surgery on June 12, 2024 with Dr. Comer Viktoria at [facility] You will be scheduled for robotic assisted total laparoscopic hysterectomy (removal of the uterus and cervix), bilateral salpingo-oophorectomy (removal of both ovaries and fallopian tubes), sentinel lymph node biopsy, possible lymph node dissection, possible laparotomy (larger incision on your abdomen if needed), possible staging, possible hysteroscopy with dilation and curettage of the uterus with Mirena IUD placement.   Pre-operative Testing -You will receive a phone call from presurgical testing at [facility] to arrange for a pre-operative appointment and lab work.  -Bring your insurance card, copy of an advanced directive if applicable, medication list  -At that visit, you will be asked to sign a consent for a possible blood transfusion in case a transfusion becomes necessary during surgery.  The need for a blood transfusion is rare but having consent is a necessary part of your care.     -You should not be taking blood thinners or aspirin  at least ten days prior to surgery unless instructed by your surgeon.  -Do not take supplements such as fish oil (omega 3), red yeast rice, turmeric before your surgery. STOP TAKING AT LEAST 10 DAYS BEFORE SURGERY. You want to avoid medications with aspirin  in them including headache powders such as BC or Goody's), Excedrin migraine.  -If you are taking a GLP-1 medication/injection such as Ozempic, Mounjaro, [id number], this needs to be held before surgery for at least 7 days before.  Day Before Surgery at Home -You will be asked to take in a light diet the day before surgery. You will be advised you can have clear liquids up until 3 hours before your surgery.    Eat a light diet the day before surgery.  Examples including soups, broths, toast,  yogurt, mashed potatoes.  AVOID GAS PRODUCING FOODS AND BEVERAGES. Things to avoid include carbonated beverages (fizzy beverages, sodas), raw fruits and raw vegetables (uncooked), or beans.   If your bowels are filled with gas, your surgeon will have difficulty visualizing your pelvic organs which increases your surgical risks.  Your role in recovery Your role is to become active as soon as directed by your doctor, while still giving yourself time to heal.  Rest when you feel tired. You will be asked to do the following in order to speed your recovery:  - Cough and breathe deeply. This helps to clear and expand your lungs and can prevent pneumonia after surgery.  - STAY ACTIVE WHEN YOU GET HOME. Do mild physical activity. Walking or moving your legs help your circulation and body functions return to normal. Do not try to get up or walk alone the first time after surgery.   -If you develop swelling on one leg or the other, pain in the back of your leg, redness/warmth in one of your legs, please call the office or go to the Emergency Room to have a doppler to rule out a blood clot. For shortness of breath, chest pain-seek care in the Emergency Room as soon as possible. - Actively manage your pain. Managing your pain lets you move in comfort. We will ask you to rate your pain on a scale of zero to 10. It is your responsibility to tell your doctor or nurse where and how much you hurt so your pain can be treated.  Special Considerations -If you are diabetic, you  may be placed on insulin after surgery to have closer control over your blood sugars to promote healing and recovery.  This does not mean that you will be discharged on insulin.  If applicable, your oral antidiabetics will be resumed when you are tolerating a solid diet.  -Your final pathology results from surgery should be available around one week after surgery and the results will be relayed to you when available.  -FMLA forms can be faxed to  [phone] and please allow 5-7 business days for completion.  Pain Management After Surgery -You will be prescribed your pain medication and bowel regimen medications before surgery so that you can have these available when you are discharged from the hospital. The pain medication is for use ONLY AFTER surgery and a new prescription will not be given.   -Make sure that you have Tylenol  and Ibuprofen IF YOU ARE ABLE TO TAKE THESE MEDICATIONS at home to use on a regular basis after surgery for pain control. We recommend alternating the medications every hour to six hours since they work differently and are processed in the body differently for pain relief.  -Review the attached handout on narcotic use and their risks and side effects.   Bowel Regimen -You will be prescribed Sennakot-S to take nightly to prevent constipation especially if you are taking the narcotic pain medication intermittently.  It is important to prevent constipation and drink adequate amounts of liquids. You can stop taking this medication when you are not taking pain medication and you are back on your normal bowel routine.  Risks of Surgery Risks of surgery are low but include bleeding, infection, damage to surrounding structures, re-operation, blood clots, and very rarely death.   Blood Transfusion Information (For the consent to be signed before surgery)  We will be checking your blood type before surgery so in case of emergencies, we will know what type of blood you would need.                                            WHAT IS A BLOOD TRANSFUSION?  A transfusion is the replacement of blood or some of its parts. Blood is made up of multiple cells which provide different functions. Red blood cells carry oxygen and are used for blood loss replacement. White blood cells fight against infection. Platelets control bleeding. Plasma helps clot blood. Other blood products are available for specialized needs, such as  hemophilia or other clotting disorders. BEFORE THE TRANSFUSION  Who gives blood for transfusions?  You may be able to donate blood to be used at a later date on yourself (autologous donation). Relatives can be asked to donate blood. This is generally not any safer than if you have received blood from a stranger. The same precautions are taken to ensure safety when a relative's blood is donated. Healthy volunteers who are fully evaluated to make sure their blood is safe. This is blood bank blood. Transfusion therapy is the safest it has ever been in the practice of medicine. Before blood is taken from a donor, a complete history is taken to make sure that person has no history of diseases nor engages in risky social behavior (examples are intravenous drug use or sexual activity with multiple partners). The donor's travel history is screened to minimize risk of transmitting infections, such as malaria. The donated blood is tested for signs  of infectious diseases, such as HIV and hepatitis. The blood is then tested to be sure it is compatible with you in order to minimize the chance of a transfusion reaction. If you or a relative donates blood, this is often done in anticipation of surgery and is not appropriate for emergency situations. It takes many days to process the donated blood. RISKS AND COMPLICATIONS Although transfusion therapy is very safe and saves many lives, the main dangers of transfusion include:  Getting an infectious disease. Developing a transfusion reaction. This is an allergic reaction to something in the blood you were given. Every precaution is taken to prevent this. The decision to have a blood transfusion has been considered carefully by your caregiver before blood is given. Blood is not given unless the benefits outweigh the risks.  AFTER SURGERY INSTRUCTIONS  Return to work: 4-6 weeks if applicable  Activity: 1. Be up and out of the bed during the day.  Take a nap if needed.   You may walk up steps but be careful and use the hand rail.  Stair climbing will tire you more than you think, you may need to stop part way and rest.   2. No lifting or straining for 6 weeks over 10 pounds. No pushing, pulling, straining for 6 weeks.  3. No driving for 4-89 days when the following criteria have been met: Do not drive if you are taking narcotic pain medicine and make sure that your reaction time has returned.   4. You can shower as soon as the next day after surgery. Shower daily.  Use your regular soap and water (not directly on the incision) and pat your incision(s) dry afterwards; don't rub.  No tub baths or submerging your body in water until cleared by your surgeon. If you have the soap that was given to you by pre-surgical testing that was used before surgery, you do not need to use it afterwards because this can irritate your incisions.   5. No sexual activity and nothing in the vagina for 12 weeks.  6. You may experience a small amount of clear drainage from your incisions, which is normal.  If the drainage persists, increases, or changes color please call the office.  7. Do not use creams, lotions, or ointments such as neosporin on your incisions after surgery until advised by your surgeon because they can cause removal of the dermabond glue on your incisions.    8. You may experience vaginal spotting after surgery or when the stitches at the top of the vagina begin to dissolve.  The spotting is normal but if you experience heavy bleeding, call our office.  9. Take Tylenol  or ibuprofen first for pain if you are able to take these medications and only use narcotic pain medication for severe pain not relieved by the Tylenol  or Ibuprofen.  Monitor your Tylenol  intake to a max of 4,000 mg in a 24 hour period. You can alternate these medications after surgery.  Diet: 1. Low sodium Heart Healthy Diet is recommended but you are cleared to resume your normal (before surgery)  diet after your procedure.  2. It is safe to use a laxative, such as Miralax or Colace, if you have difficulty moving your bowels before surgery. You have been prescribed Sennakot-S to take at bedtime every evening after surgery to keep bowel movements regular and to prevent constipation.    Wound Care: 1. Keep clean and dry.  Shower daily.  Reasons to call the Doctor:  Fever - Oral temperature greater than 100.4 degrees Fahrenheit Foul-smelling vaginal discharge Difficulty urinating Nausea and vomiting Increased pain at the site of the incision that is unrelieved with pain medicine. Difficulty breathing with or without chest pain New calf pain especially if only on one side Sudden, continuing increased vaginal bleeding with or without clots.   Contacts: For questions or concerns you should contact:  Dr. Comer Dollar at [phone]  Eleanor Epps, NP at [phone]  After Hours: call [phone] and have the GYN Oncologist paged/contacted (after 5 pm or on the weekends). You will speak with an after hours RN and let he or she know you have had surgery.  Messages sent via mychart are for non-urgent matters and are not responded to after hours so for urgent needs, please call the after hours number.

## 2024-05-06 DIAGNOSIS — N8502 Endometrial intraepithelial neoplasia [EIN]: Secondary | ICD-10-CM | POA: Diagnosis not present

## 2024-05-06 DIAGNOSIS — I10 Essential (primary) hypertension: Secondary | ICD-10-CM | POA: Diagnosis not present

## 2024-05-06 DIAGNOSIS — E039 Hypothyroidism, unspecified: Secondary | ICD-10-CM | POA: Diagnosis not present

## 2024-05-06 DIAGNOSIS — Z01818 Encounter for other preprocedural examination: Secondary | ICD-10-CM | POA: Diagnosis not present

## 2024-05-07 ENCOUNTER — Encounter: Payer: Self-pay | Admitting: Obstetrics and Gynecology

## 2024-05-07 NOTE — Telephone Encounter (Signed)
 Received PCP clearance.

## 2024-05-13 NOTE — Patient Instructions (Signed)
 Dr. Viktoria has sent in provera to start taking now. See handout for further information.  Preparing for your Surgery  Plan for surgery on June 12, 2024 with Dr. Comer Viktoria at [facility] You will be scheduled for robotic assisted total laparoscopic hysterectomy (removal of the uterus and cervix), bilateral salpingo-oophorectomy (removal of both ovaries and fallopian tubes), sentinel lymph node biopsy, possible lymph node dissection, possible laparotomy (larger incision on your abdomen if needed), possible staging, possible hysteroscopy with dilation and curettage of the uterus with Mirena IUD placement.   Pre-operative Testing -You will receive a phone call from presurgical testing at [facility] to arrange for a pre-operative appointment and lab work.  -Bring your insurance card, copy of an advanced directive if applicable, medication list  -At that visit, you will be asked to sign a consent for a possible blood transfusion in case a transfusion becomes necessary during surgery.  The need for a blood transfusion is rare but having consent is a necessary part of your care.     -You should not be taking blood thinners or aspirin  at least ten days prior to surgery unless instructed by your surgeon.  -Do not take supplements such as fish oil (omega 3), red yeast rice, turmeric before your surgery. STOP TAKING AT LEAST 10 DAYS BEFORE SURGERY. You want to avoid medications with aspirin  in them including headache powders such as BC or Goody's), Excedrin migraine.  -If you are taking a GLP-1 medication/injection such as Ozempic, Mounjaro, [id number], this needs to be held before surgery for at least 7 days before.  Day Before Surgery at Home -You will be asked to take in a light diet the day before surgery. You will be advised you can have clear liquids up until 3 hours before your surgery.    Eat a light diet the day before surgery.  Examples including soups, broths, toast,  yogurt, mashed potatoes.  AVOID GAS PRODUCING FOODS AND BEVERAGES. Things to avoid include carbonated beverages (fizzy beverages, sodas), raw fruits and raw vegetables (uncooked), or beans.   If your bowels are filled with gas, your surgeon will have difficulty visualizing your pelvic organs which increases your surgical risks.  Your role in recovery Your role is to become active as soon as directed by your doctor, while still giving yourself time to heal.  Rest when you feel tired. You will be asked to do the following in order to speed your recovery:  - Cough and breathe deeply. This helps to clear and expand your lungs and can prevent pneumonia after surgery.  - STAY ACTIVE WHEN YOU GET HOME. Do mild physical activity. Walking or moving your legs help your circulation and body functions return to normal. Do not try to get up or walk alone the first time after surgery.   -If you develop swelling on one leg or the other, pain in the back of your leg, redness/warmth in one of your legs, please call the office or go to the Emergency Room to have a doppler to rule out a blood clot. For shortness of breath, chest pain-seek care in the Emergency Room as soon as possible. - Actively manage your pain. Managing your pain lets you move in comfort. We will ask you to rate your pain on a scale of zero to 10. It is your responsibility to tell your doctor or nurse where and how much you hurt so your pain can be treated.  Special Considerations -If you are diabetic, you  may be placed on insulin after surgery to have closer control over your blood sugars to promote healing and recovery.  This does not mean that you will be discharged on insulin.  If applicable, your oral antidiabetics will be resumed when you are tolerating a solid diet.  -Your final pathology results from surgery should be available around one week after surgery and the results will be relayed to you when available.  -FMLA forms can be faxed to  [phone] and please allow 5-7 business days for completion.  Pain Management After Surgery -You will be prescribed your pain medication and bowel regimen medications before surgery so that you can have these available when you are discharged from the hospital. The pain medication is for use ONLY AFTER surgery and a new prescription will not be given.   -Make sure that you have Tylenol  and Ibuprofen IF YOU ARE ABLE TO TAKE THESE MEDICATIONS at home to use on a regular basis after surgery for pain control. We recommend alternating the medications every hour to six hours since they work differently and are processed in the body differently for pain relief.  -Review the attached handout on narcotic use and their risks and side effects.   Bowel Regimen -You will be prescribed Sennakot-S to take nightly to prevent constipation especially if you are taking the narcotic pain medication intermittently.  It is important to prevent constipation and drink adequate amounts of liquids. You can stop taking this medication when you are not taking pain medication and you are back on your normal bowel routine.  Risks of Surgery Risks of surgery are low but include bleeding, infection, damage to surrounding structures, re-operation, blood clots, and very rarely death.   Blood Transfusion Information (For the consent to be signed before surgery)  We will be checking your blood type before surgery so in case of emergencies, we will know what type of blood you would need.                                            WHAT IS A BLOOD TRANSFUSION?  A transfusion is the replacement of blood or some of its parts. Blood is made up of multiple cells which provide different functions. Red blood cells carry oxygen and are used for blood loss replacement. White blood cells fight against infection. Platelets control bleeding. Plasma helps clot blood. Other blood products are available for specialized needs, such as  hemophilia or other clotting disorders. BEFORE THE TRANSFUSION  Who gives blood for transfusions?  You may be able to donate blood to be used at a later date on yourself (autologous donation). Relatives can be asked to donate blood. This is generally not any safer than if you have received blood from a stranger. The same precautions are taken to ensure safety when a relative's blood is donated. Healthy volunteers who are fully evaluated to make sure their blood is safe. This is blood bank blood. Transfusion therapy is the safest it has ever been in the practice of medicine. Before blood is taken from a donor, a complete history is taken to make sure that person has no history of diseases nor engages in risky social behavior (examples are intravenous drug use or sexual activity with multiple partners). The donor's travel history is screened to minimize risk of transmitting infections, such as malaria. The donated blood is tested for signs  of infectious diseases, such as HIV and hepatitis. The blood is then tested to be sure it is compatible with you in order to minimize the chance of a transfusion reaction. If you or a relative donates blood, this is often done in anticipation of surgery and is not appropriate for emergency situations. It takes many days to process the donated blood. RISKS AND COMPLICATIONS Although transfusion therapy is very safe and saves many lives, the main dangers of transfusion include:  Getting an infectious disease. Developing a transfusion reaction. This is an allergic reaction to something in the blood you were given. Every precaution is taken to prevent this. The decision to have a blood transfusion has been considered carefully by your caregiver before blood is given. Blood is not given unless the benefits outweigh the risks.  AFTER SURGERY INSTRUCTIONS  Return to work: 4-6 weeks if applicable  Activity: 1. Be up and out of the bed during the day.  Take a nap if needed.   You may walk up steps but be careful and use the hand rail.  Stair climbing will tire you more than you think, you may need to stop part way and rest.   2. No lifting or straining for 6 weeks over 10 pounds. No pushing, pulling, straining for 6 weeks.  3. No driving for 4-89 days when the following criteria have been met: Do not drive if you are taking narcotic pain medicine and make sure that your reaction time has returned.   4. You can shower as soon as the next day after surgery. Shower daily.  Use your regular soap and water (not directly on the incision) and pat your incision(s) dry afterwards; don't rub.  No tub baths or submerging your body in water until cleared by your surgeon. If you have the soap that was given to you by pre-surgical testing that was used before surgery, you do not need to use it afterwards because this can irritate your incisions.   5. No sexual activity and nothing in the vagina for 12 weeks.  6. You may experience a small amount of clear drainage from your incisions, which is normal.  If the drainage persists, increases, or changes color please call the office.  7. Do not use creams, lotions, or ointments such as neosporin on your incisions after surgery until advised by your surgeon because they can cause removal of the dermabond glue on your incisions.    8. You may experience vaginal spotting after surgery or when the stitches at the top of the vagina begin to dissolve.  The spotting is normal but if you experience heavy bleeding, call our office.  9. Take Tylenol  or ibuprofen first for pain if you are able to take these medications and only use narcotic pain medication for severe pain not relieved by the Tylenol  or Ibuprofen.  Monitor your Tylenol  intake to a max of 4,000 mg in a 24 hour period. You can alternate these medications after surgery.  Diet: 1. Low sodium Heart Healthy Diet is recommended but you are cleared to resume your normal (before surgery)  diet after your procedure.  2. It is safe to use a laxative, such as Miralax or Colace, if you have difficulty moving your bowels before surgery. You have been prescribed Sennakot-S to take at bedtime every evening after surgery to keep bowel movements regular and to prevent constipation.    Wound Care: 1. Keep clean and dry.  Shower daily.  Reasons to call the Doctor:  Fever - Oral temperature greater than 100.4 degrees Fahrenheit Foul-smelling vaginal discharge Difficulty urinating Nausea and vomiting Increased pain at the site of the incision that is unrelieved with pain medicine. Difficulty breathing with or without chest pain New calf pain especially if only on one side Sudden, continuing increased vaginal bleeding with or without clots.   Contacts: For questions or concerns you should contact:  Dr. Comer Dollar at [phone]  Eleanor Epps, NP at [phone]  After Hours: call [phone] and have the GYN Oncologist paged/contacted (after 5 pm or on the weekends). You will speak with an after hours RN and let he or she know you have had surgery.  Messages sent via mychart are for non-urgent matters and are not responded to after hours so for urgent needs, please call the after hours number.

## 2024-05-13 NOTE — Progress Notes (Signed)
 Patient here for new patient consultation with Dr. Viktoria and for a pre-operative appointment prior to her scheduled surgery on 06/12/2024. She is scheduled for robotic assisted total laparoscopic hysterectomy (removal of the uterus and cervix), bilateral salpingo-oophorectomy (removal of both ovaries and fallopian tubes), sentinel lymph node biopsy, possible lymph node dissection, possible laparotomy (larger incision on your abdomen if needed), possible staging, possible hysteroscopy with dilation and curettage of the uterus with Mirena IUD placement. The surgery was discussed in detail.  See after visit summary for additional details.      Discussed post-op pain management in detail including the aspects of the enhanced recovery pathway.  Advised her that a new prescription would be sent in for tramadol and it is only to be used for after her upcoming surgery.  We discussed the use of tylenol  post-op and to monitor for a maximum of 4,000 mg in a 24 hour period.  Also prescribed sennakot to be used after surgery and to hold if having loose stools.  Discussed bowel regimen in detail.     Discussed measures to take at home to prevent DVT including frequent mobility.  Reportable signs and symptoms of DVT discussed. Post-operative instructions discussed and expectations for after surgery. Incisional care discussed as well including reportable signs and symptoms including erythema, drainage, wound separation.     10 minutes spent preparing information and with the patient.  Verbalizing understanding of material discussed. No needs or concerns voiced at the end of the visit.   Advised patient to call for any needs.  Advised that her post-operative medications had been prescribed and could be picked up at any time.    This appointment is included in the global surgical bundle as pre-operative teaching and has no charge.

## 2024-06-05 NOTE — Progress Notes (Signed)
 COVID Vaccine Completed: yes  Date of COVID positive in last 90 days:  PCP - Vyvyan Sun, MD Cardiologist - Arland Don, MD for a flutter due to medication- no f/u needed  Chest x-ray - N/A EKG - 05/08/24 with PCP- in media tab dated 06/07/24 Stress Test - N/A ECHO - N/A Cardiac Cath - n/a Pacemaker/ICD device last checked:N/A Spinal Cord Stimulator:N/A  Bowel Prep - N/A  Sleep Study - N/A CPAP -   Fasting Blood Sugar - borderline preDM, no meds or checks at home Checks Blood Sugar _____ times a day  Last dose of GLP1 agonist-  N/A GLP1 instructions:  Do not take after     Last dose of SGLT-2 inhibitors-  N/A SGLT-2 instructions:  Do not take after     Blood Thinner Instructions: N/A Last dose:   Time: Aspirin  Instructions:N/A Last Dose:  Activity level: Can go up a flight of stairs and perform activities of daily living without stopping and without symptoms of chest pain or shortness of breath.   Anesthesia review: HTN, a flutter-saw cards in 2018, athma  Patient denies shortness of breath, fever, cough and chest pain at PAT appointment  Patient verbalized understanding of instructions that were given to them at the PAT appointment. Patient was also instructed that they will need to review over the PAT instructions again at home before surgery.

## 2024-06-05 NOTE — Patient Instructions (Signed)
 SURGICAL WAITING ROOM VISITATION  Patients having surgery or a procedure may have no more than 2 support people in the waiting area - these visitors may rotate.    Children under the age of 39 must have an adult with them who is not the patient.  Visitors with respiratory illnesses are discouraged from visiting and should remain at home.  If the patient needs to stay at the hospital during part of their recovery, the visitor guidelines for inpatient rooms apply. Pre-op nurse will coordinate an appropriate time for 1 support person to accompany patient in pre-op.  This support person may not rotate.    Please refer to the [facility] website for the visitor guidelines for Inpatients (after your surgery is over and you are in a regular room).    Your procedure is scheduled on: 06/12/24   Report to [facility] Main Entrance    Report to admitting at 9:15 AM   Call this number if you have problems the morning of surgery [phone]   Do not eat food :After Midnight.   After Midnight you may have the following liquids until 8:30 AM DAY OF SURGERY  Water Non-Citrus Juices (without pulp, NO RED-Apple, White grape, White cranberry) Black Coffee (NO MILK/CREAM OR CREAMERS, sugar ok)  Clear Tea (NO MILK/CREAM OR CREAMERS, sugar ok) regular and decaf                             Plain Jell-O (NO RED)                                           Fruit ices (not with fruit pulp, NO RED)                                     Popsicles (NO RED)                                                               Sports drinks like Gatorade (NO RED)                      If you have questions, please contact your surgeon's office.   FOLLOW BOWEL PREP AND ANY ADDITIONAL PRE OP INSTRUCTIONS YOU RECEIVED FROM YOUR SURGEON'S OFFICE!!!     Oral Hygiene is also important to reduce your risk of infection.                                    Remember - BRUSH YOUR TEETH THE MORNING OF SURGERY WITH YOUR  REGULAR TOOTHPASTE  DENTURES WILL BE REMOVED PRIOR TO SURGERY PLEASE DO NOT APPLY Poly grip OR ADHESIVES!!!   Stop all vitamins and herbal supplements 7 days before surgery.   Take these medicines the morning of surgery with A SIP OF WATER: Levothyroxine   You may not have any metal on your body including hair pins, jewelry, and body piercing             Do not wear make-up, lotions, powders, perfumes, or deodorant  Do not wear nail polish including gel and S&S, artificial/acrylic nails, or any other type of covering on natural nails including finger and toenails. If you have artificial nails, gel coating, etc. that needs to be removed by a nail salon please have this removed prior to surgery or surgery may need to be canceled/ delayed if the surgeon/ anesthesia feels like they are unable to be safely monitored.   Do not shave  48 hours prior to surgery.    Do not bring valuables to the hospital. [place] IS NOT             RESPONSIBLE   FOR VALUABLES.   Contacts, glasses, dentures or bridgework may not be worn into surgery.  DO NOT BRING YOUR HOME MEDICATIONS TO THE HOSPITAL. PHARMACY WILL DISPENSE MEDICATIONS LISTED ON YOUR MEDICATION LIST TO YOU DURING YOUR ADMISSION IN THE HOSPITAL!    Patients discharged on the day of surgery will not be allowed to drive home.  Someone NEEDS to stay with you for the first 24 hours after anesthesia.   Special Instructions: Bring a copy of your healthcare power of attorney and living will documents the day of surgery if you haven't scanned them before.              Please read over the following fact sheets you were given: IF YOU HAVE QUESTIONS ABOUT YOUR PRE-OP INSTRUCTIONS PLEASE CALL [phone].   If you received a COVID test during your pre-op visit  it is requested that you wear a mask when out in public, stay away from anyone that may not be feeling well and notify your surgeon if you develop  symptoms. If you test positive for Covid or have been in contact with anyone that has tested positive in the last 10 days please notify you surgeon.    [facility] - Preparing for Surgery Before surgery, you can play an important role.  Because skin is not sterile, your skin needs to be as free of germs as possible.  You can reduce the number of germs on your skin by washing with CHG (chlorahexidine gluconate) soap before surgery.  CHG is an antiseptic cleaner which kills germs and bonds with the skin to continue killing germs even after washing. Please DO NOT use if you have an allergy to CHG or antibacterial soaps.  If your skin becomes reddened/irritated stop using the CHG and inform your nurse when you arrive at Short Stay. Do not shave (including legs and underarms) for at least 48 hours prior to the first CHG shower.  You may shave your face/neck.  Please follow these instructions carefully:  1.  Shower with CHG Soap the night before surgery ONLY (DO NOT USE THE SOAP THE MORNING OF SURGERY).  2.  If you choose to wash your hair, wash your hair first as usual with your normal  shampoo.  3.  After you shampoo, rinse your hair and body thoroughly to remove the shampoo.                             4.  Use CHG as you would any other liquid soap.  You can apply chg directly to the skin and wash.  Gently with a scrungie or clean washcloth.  5.  Apply the CHG Soap to your body ONLY FROM THE NECK DOWN.   Do   not use on face/ open                           Wound or open sores. Avoid contact with eyes, ears mouth and   genitals (private parts).                       Wash face,  Genitals (private parts) with your normal soap.             6.  Wash thoroughly, paying special attention to the area where your    surgery  will be performed.  7.  Thoroughly rinse your body with warm water from the neck down.  8.  DO NOT shower/wash with your normal soap after using and rinsing off the CHG Soap.                 9.  Pat yourself dry with a clean towel.            10.  Wear clean pajamas.            11.  Place clean sheets on your bed the night of your first shower and do not  sleep with pets. Day of Surgery : Do not apply any CHG, lotions/deodorants the morning of surgery.  Please wear clean clothes to the hospital/surgery center.  FAILURE TO FOLLOW THESE INSTRUCTIONS MAY RESULT IN THE CANCELLATION OF YOUR SURGERY  PATIENT SIGNATURE_________________________________  NURSE SIGNATURE__________________________________  ________________________________________________________________________ WHAT IS A BLOOD TRANSFUSION? Blood Transfusion Information  A transfusion is the replacement of blood or some of its parts. Blood is made up of multiple cells which provide different functions. Red blood cells carry oxygen and are used for blood loss replacement. White blood cells fight against infection. Platelets control bleeding. Plasma helps clot blood. Other blood products are available for specialized needs, such as hemophilia or other clotting disorders. BEFORE THE TRANSFUSION  Who gives blood for transfusions?  Healthy volunteers who are fully evaluated to make sure their blood is safe. This is blood bank blood. Transfusion therapy is the safest it has ever been in the practice of medicine. Before blood is taken from a donor, a complete history is taken to make sure that person has no history of diseases nor engages in risky social behavior (examples are intravenous drug use or sexual activity with multiple partners). The donor's travel history is screened to minimize risk of transmitting infections, such as malaria. The donated blood is tested for signs of infectious diseases, such as HIV and hepatitis. The blood is then tested to be sure it is compatible with you in order to minimize the chance of a transfusion reaction. If you or a relative donates blood, this is often done in anticipation of surgery and  is not appropriate for emergency situations. It takes many days to process the donated blood. RISKS AND COMPLICATIONS Although transfusion therapy is very safe and saves many lives, the main dangers of transfusion include:  Getting an infectious disease. Developing a transfusion reaction. This is an allergic reaction to something in the blood you were given. Every precaution is taken to prevent this. The decision to have a blood transfusion has been considered carefully by your caregiver before blood is given. Blood is not given unless the benefits outweigh  the risks. AFTER THE TRANSFUSION Right after receiving a blood transfusion, you will usually feel much better and more energetic. This is especially true if your red blood cells have gotten low (anemic). The transfusion raises the level of the red blood cells which carry oxygen, and this usually causes an energy increase. The nurse administering the transfusion will monitor you carefully for complications. HOME CARE INSTRUCTIONS  No special instructions are needed after a transfusion. You may find your energy is better. Speak with your caregiver about any limitations on activity for underlying diseases you may have. SEEK MEDICAL CARE IF:  Your condition is not improving after your transfusion. You develop redness or irritation at the intravenous (IV) site. SEEK IMMEDIATE MEDICAL CARE IF:  Any of the following symptoms occur over the next 12 hours: Shaking chills. You have a temperature by mouth above 102 F (38.9 C), not controlled by medicine. Chest, back, or muscle pain. People around you feel you are not acting correctly or are confused. Shortness of breath or difficulty breathing. Dizziness and fainting. You get a rash or develop hives. You have a decrease in urine output. Your urine turns a dark color or changes to pink, red, or brown. Any of the following symptoms occur over the next 10 days: You have a temperature by mouth above  102 F (38.9 C), not controlled by medicine. Shortness of breath. Weakness after normal activity. The white part of the eye turns yellow (jaundice). You have a decrease in the amount of urine or are urinating less often. Your urine turns a dark color or changes to pink, red, or brown. Document Released: 07/15/2000 Document Revised: 10/10/2011 Document Reviewed: 03/03/2008 [facility] Patient Information 2014 [place], [place].  _______________________________________________________________________

## 2024-06-06 ENCOUNTER — Encounter (HOSPITAL_COMMUNITY)
Admission: RE | Admit: 2024-06-06 | Discharge: 2024-06-06 | Disposition: A | Discharge status: Home / Self Care | Source: Ambulatory Visit | Attending: Gynecologic Oncology | Admitting: Gynecologic Oncology

## 2024-06-06 ENCOUNTER — Other Ambulatory Visit: Payer: Self-pay

## 2024-06-06 ENCOUNTER — Encounter (HOSPITAL_COMMUNITY): Payer: Self-pay

## 2024-06-06 VITALS — BP 148/65 | HR 67 | Temp 98.1°F | Ht 63.0 in | Wt 265.0 lb

## 2024-06-06 DIAGNOSIS — I10 Essential (primary) hypertension: Secondary | ICD-10-CM | POA: Insufficient documentation

## 2024-06-06 DIAGNOSIS — N8502 Endometrial intraepithelial neoplasia [EIN]: Secondary | ICD-10-CM | POA: Insufficient documentation

## 2024-06-06 DIAGNOSIS — Z01812 Encounter for preprocedural laboratory examination: Secondary | ICD-10-CM | POA: Insufficient documentation

## 2024-06-06 HISTORY — DX: Hypothyroidism, unspecified: E03.9

## 2024-06-06 HISTORY — DX: Essential (primary) hypertension: I10

## 2024-06-06 HISTORY — DX: Deviated nasal septum: J34.2

## 2024-06-06 LAB — CBC
HCT: 44.6 % (ref 36.0–46.0)
Hemoglobin: 15.4 g/dL — ABNORMAL HIGH (ref 12.0–15.0)
MCH: 32.4 pg (ref 26.0–34.0)
MCHC: 34.5 g/dL (ref 30.0–36.0)
MCV: 93.7 fL (ref 80.0–100.0)
Platelets: 280 K/uL (ref 150–400)
RBC: 4.76 MIL/uL (ref 3.87–5.11)
RDW: 13.4 % (ref 11.5–15.5)
WBC: 5.4 K/uL (ref 4.0–10.5)
nRBC: 0 % (ref 0.0–0.2)

## 2024-06-06 LAB — COMPREHENSIVE METABOLIC PANEL WITH GFR
ALT: 12 U/L (ref 0–44)
AST: 20 U/L (ref 15–41)
Albumin: 4.3 g/dL (ref 3.5–5.0)
Alkaline Phosphatase: 68 U/L (ref 38–126)
Anion gap: 11 (ref 5–15)
BUN: 11 mg/dL (ref 8–23)
CO2: 25 mmol/L (ref 22–32)
Calcium: 9.7 mg/dL (ref 8.9–10.3)
Chloride: 103 mmol/L (ref 98–111)
Creatinine, Ser: 0.68 mg/dL (ref 0.44–1.00)
GFR, Estimated: >60 mL/min (ref >60)
Glucose, Bld: 98 mg/dL (ref 70–99)
Potassium: 4.4 mmol/L (ref 3.5–5.1)
Sodium: 139 mmol/L (ref 135–145)
Total Bilirubin: 0.6 mg/dL (ref 0.0–1.2)
Total Protein: 7.3 g/dL (ref 6.5–8.1)

## 2024-06-11 ENCOUNTER — Telehealth: Payer: Self-pay

## 2024-06-11 NOTE — Telephone Encounter (Signed)
 Telephone call to check on pre-operative status.  Patient compliant with pre-operative instructions.  Reinforced nothing to eat after midnight. Clear liquids until 0830. Patient to arrive at 0915.  No questions or concerns voiced.  Instructed to call for any needs.

## 2024-06-11 NOTE — Anesthesia Preprocedure Evaluation (Signed)
 Anesthesia Evaluation  Patient identified by MRN, date of birth, ID band Patient awake    Reviewed: Allergy & Precautions, NPO status , Patient's Chart, lab work & pertinent test results  Airway Mallampati: III  TM Distance: >3 FB Neck ROM: Limited    Dental  (+) Dental Advisory Given, Teeth Intact   Pulmonary asthma , neg recent URI   Pulmonary exam normal breath sounds clear to auscultation       Cardiovascular hypertension, Pt. on medications Normal cardiovascular exam Rhythm:Regular Rate:Normal     Neuro/Psych  PSYCHIATRIC DISORDERS  Depression    negative neurological ROS     GI/Hepatic Neg liver ROS,GERD  Poorly Controlled,,  Endo/Other  Hypothyroidism  Class 3 obesity  Renal/GU negative Renal ROS     Musculoskeletal  (+) Arthritis ,    Abdominal  (+) + obese  Peds  Hematology negative hematology ROS (+)   Anesthesia Other Findings   Reproductive/Obstetrics                              Anesthesia Physical Anesthesia Plan  ASA: 3  Anesthesia Plan: General   Post-op Pain Management: Tylenol  PO (pre-op)* and Gabapentin PO (pre-op)*   Induction: Intravenous and Rapid sequence  PONV Risk Score and Plan: 4 or greater and Ondansetron, Dexamethasone and Treatment may vary due to age or medical condition  Airway Management Planned: Oral ETT  Additional Equipment:   Intra-op Plan:   Post-operative Plan: Extubation in OR  Informed Consent: I have reviewed the patients History and Physical, chart, labs and discussed the procedure including the risks, benefits and alternatives for the proposed anesthesia with the patient or authorized representative who has indicated his/her understanding and acceptance.     Dental advisory given  Plan Discussed with: CRNA  Anesthesia Plan Comments:          Anesthesia Quick Evaluation

## 2024-06-11 NOTE — Discharge Instructions (Signed)
 AFTER SURGERY INSTRUCTIONS   Return to work: 4-6 weeks if applicable  You have stitches at the entrance to the vagina and slightly past. Soreness can be expected. You can rinse the vulva after toileting and pat dry.   Activity: 1. Be up and out of the bed during the day.  Take a nap if needed.  You may walk up steps but be careful and use the hand rail.  Stair climbing will tire you more than you think, you may need to stop part way and rest.    2. No lifting or straining for 6 weeks over 10 pounds. No pushing, pulling, straining for 6 weeks.   3. No driving for 4-89 days when the following criteria have been met: Do not drive if you are taking narcotic pain medicine and make sure that your reaction time has returned.    4. You can shower as soon as the next day after surgery. Shower daily.  Use your regular soap and water (not directly on the incision) and pat your incision(s) dry afterwards; don't rub.  No tub baths or submerging your body in water until cleared by your surgeon. If you have the soap that was given to you by pre-surgical testing that was used before surgery, you do not need to use it afterwards because this can irritate your incisions.    5. No sexual activity and nothing in the vagina for 12 weeks.   6. You may experience a small amount of clear drainage from your incisions, which is normal.  If the drainage persists, increases, or changes color please call the office.   7. Do not use creams, lotions, or ointments such as neosporin on your incisions after surgery until advised by your surgeon because they can cause removal of the dermabond glue on your incisions.     8. You may experience vaginal spotting after surgery or when the stitches at the top of the vagina begin to dissolve.  The spotting is normal but if you experience heavy bleeding, call our office.   9. Take Tylenol  or ibuprofen first for pain if you are able to take these medications and only use narcotic pain  medication for severe pain not relieved by the Tylenol  or Ibuprofen.  Monitor your Tylenol  intake to a max of 4,000 mg in a 24 hour period. You can alternate these medications after surgery.   Diet: 1. Low sodium Heart Healthy Diet is recommended but you are cleared to resume your normal (before surgery) diet after your procedure.   2. It is safe to use a laxative, such as Miralax or Colace, if you have difficulty moving your bowels before surgery. You have been prescribed Sennakot-S to take at bedtime every evening after surgery to keep bowel movements regular and to prevent constipation.     Wound Care: 1. Keep clean and dry.  Shower daily.   Reasons to call the Doctor: Fever - Oral temperature greater than 100.4 degrees Fahrenheit Foul-smelling vaginal discharge Difficulty urinating Nausea and vomiting Increased pain at the site of the incision that is unrelieved with pain medicine. Difficulty breathing with or without chest pain New calf pain especially if only on one side Sudden, continuing increased vaginal bleeding with or without clots.   Contacts: For questions or concerns you should contact:   Dr. Comer Dollar at [phone]   Eleanor Epps, NP at [phone]   After Hours: call [phone] and have the GYN Oncologist paged/contacted (after 5 pm or on the weekends).  You will speak with an after hours RN and let he or she know you have had surgery.   Messages sent via mychart are for non-urgent matters and are not responded to after hours so for urgent needs, please call the after hours number.

## 2024-06-12 ENCOUNTER — Ambulatory Visit (HOSPITAL_COMMUNITY): Payer: Self-pay | Admitting: Physician Assistant

## 2024-06-12 ENCOUNTER — Ambulatory Visit (HOSPITAL_COMMUNITY)
Admission: RE | Admit: 2024-06-12 | Discharge: 2024-06-13 | Disposition: A | Discharge status: Home / Self Care | Source: Ambulatory Visit | Attending: Gynecologic Oncology | Admitting: Gynecologic Oncology

## 2024-06-12 ENCOUNTER — Ambulatory Visit (HOSPITAL_COMMUNITY): Payer: Self-pay | Admitting: Anesthesiology

## 2024-06-12 ENCOUNTER — Other Ambulatory Visit: Payer: Self-pay

## 2024-06-12 ENCOUNTER — Encounter (HOSPITAL_COMMUNITY)
Admission: RE | Disposition: A | Discharge status: Home / Self Care | Payer: Self-pay | Source: Ambulatory Visit | Attending: Gynecologic Oncology

## 2024-06-12 ENCOUNTER — Encounter (HOSPITAL_COMMUNITY): Payer: Self-pay | Admitting: Gynecologic Oncology

## 2024-06-12 DIAGNOSIS — N83292 Other ovarian cyst, left side: Secondary | ICD-10-CM | POA: Insufficient documentation

## 2024-06-12 DIAGNOSIS — I10 Essential (primary) hypertension: Secondary | ICD-10-CM

## 2024-06-12 DIAGNOSIS — Z79899 Other long term (current) drug therapy: Secondary | ICD-10-CM | POA: Diagnosis not present

## 2024-06-12 DIAGNOSIS — E039 Hypothyroidism, unspecified: Secondary | ICD-10-CM | POA: Diagnosis not present

## 2024-06-12 DIAGNOSIS — Z809 Family history of malignant neoplasm, unspecified: Secondary | ICD-10-CM | POA: Diagnosis not present

## 2024-06-12 DIAGNOSIS — N809 Endometriosis, unspecified: Secondary | ICD-10-CM | POA: Diagnosis not present

## 2024-06-12 DIAGNOSIS — N8003 Adenomyosis of the uterus: Secondary | ICD-10-CM | POA: Diagnosis not present

## 2024-06-12 DIAGNOSIS — E66813 Obesity, class 3: Secondary | ICD-10-CM | POA: Diagnosis not present

## 2024-06-12 DIAGNOSIS — C541 Malignant neoplasm of endometrium: Secondary | ICD-10-CM | POA: Insufficient documentation

## 2024-06-12 DIAGNOSIS — N8502 Endometrial intraepithelial neoplasia [EIN]: Secondary | ICD-10-CM

## 2024-06-12 DIAGNOSIS — D259 Leiomyoma of uterus, unspecified: Secondary | ICD-10-CM | POA: Insufficient documentation

## 2024-06-12 DIAGNOSIS — N888 Other specified noninflammatory disorders of cervix uteri: Secondary | ICD-10-CM | POA: Diagnosis not present

## 2024-06-12 DIAGNOSIS — N83202 Unspecified ovarian cyst, left side: Secondary | ICD-10-CM | POA: Diagnosis not present

## 2024-06-12 DIAGNOSIS — N83291 Other ovarian cyst, right side: Secondary | ICD-10-CM | POA: Insufficient documentation

## 2024-06-12 DIAGNOSIS — N8000 Endometriosis of the uterus, unspecified: Secondary | ICD-10-CM | POA: Diagnosis not present

## 2024-06-12 DIAGNOSIS — Z6841 Body Mass Index (BMI) 40.0 and over, adult: Secondary | ICD-10-CM | POA: Diagnosis not present

## 2024-06-12 DIAGNOSIS — N83201 Unspecified ovarian cyst, right side: Secondary | ICD-10-CM | POA: Diagnosis not present

## 2024-06-12 HISTORY — PX: INJECTION, FOR SENTINEL LYMPH NODE IDENTIFICATION: SHX7598

## 2024-06-12 HISTORY — PX: ROBOTIC ASSISTED TOTAL HYSTERECTOMY WITH BILATERAL SALPINGO OOPHERECTOMY: SHX6086

## 2024-06-12 LAB — TYPE AND SCREEN
ABO/RH(D): A POS
Antibody Screen: NEGATIVE

## 2024-06-12 LAB — ABO/RH: ABO/RH(D): A POS

## 2024-06-12 SURGERY — HYSTERECTOMY, TOTAL, ROBOT-ASSISTED, LAPAROSCOPIC, WITH BILATERAL SALPINGO-OOPHORECTOMY
Anesthesia: General | Site: Pelvis

## 2024-06-12 MED ORDER — TRAMADOL HCL 50 MG PO TABS: 50.0000 mg | ORAL_TABLET | Freq: Four times a day (QID) | ORAL | Status: DC | PRN

## 2024-06-12 MED ORDER — LIOTHYRONINE SODIUM 25 MCG PO TABS
12.5000 ug | ORAL_TABLET | Freq: Every day | ORAL | Status: DC
  Administered 2024-06-13: 12.5 ug via ORAL
  Filled 2024-06-12: qty 1

## 2024-06-12 MED ORDER — BUPIVACAINE HCL (PF) 0.25 % IJ SOLN
INTRAMUSCULAR | Status: AC
  Filled 2024-06-12: qty 30

## 2024-06-12 MED ORDER — HEPARIN SODIUM (PORCINE) 5000 UNIT/ML IJ SOLN
5000.0000 [IU] | INTRAMUSCULAR | Status: AC
  Administered 2024-06-12: 5000 [IU] via SUBCUTANEOUS
  Filled 2024-06-12: qty 1

## 2024-06-12 MED ORDER — LACTATED RINGERS IV SOLN: INTRAVENOUS | Status: DC

## 2024-06-12 MED ORDER — BUPIVACAINE HCL 0.25 % IJ SOLN
INTRAMUSCULAR | Status: DC | PRN
Start: 2024-06-12 — End: 2024-06-12
  Administered 2024-06-12: 27 mL

## 2024-06-12 MED ORDER — PHENYLEPHRINE HCL (PRESSORS) 10 MG/ML IV SOLN
INTRAVENOUS | Status: AC
  Filled 2024-06-12: qty 1

## 2024-06-12 MED ORDER — EPHEDRINE SULFATE (PRESSORS) 25 MG/5ML IV SOSY
PREFILLED_SYRINGE | INTRAVENOUS | Status: DC | PRN
  Administered 2024-06-12: 5 mg via INTRAVENOUS

## 2024-06-12 MED ORDER — STERILE WATER FOR INJECTION IJ SOLN
INTRAMUSCULAR | Status: DC | PRN
  Administered 2024-06-12: 1 mL via INTRAMUSCULAR

## 2024-06-12 MED ORDER — CIPROFLOXACIN IN D5W 400 MG/200ML IV SOLN
400.0000 mg | INTRAVENOUS | Status: AC
  Administered 2024-06-12: 400 mg via INTRAVENOUS
  Filled 2024-06-12: qty 200

## 2024-06-12 MED ORDER — ORAL CARE MOUTH RINSE: 15.0000 mL | Freq: Once | OROMUCOSAL | Status: AC

## 2024-06-12 MED ORDER — MIDAZOLAM HCL 5 MG/5ML IJ SOLN
INTRAMUSCULAR | Status: DC | PRN
  Administered 2024-06-12: 2 mg via INTRAVENOUS

## 2024-06-12 MED ORDER — FENTANYL CITRATE (PF) 100 MCG/2ML IJ SOLN
INTRAMUSCULAR | Status: DC | PRN
  Administered 2024-06-12: 50 ug via INTRAVENOUS
  Administered 2024-06-12: 100 ug via INTRAVENOUS
  Administered 2024-06-12: 50 ug via INTRAVENOUS

## 2024-06-12 MED ORDER — ENOXAPARIN SODIUM 40 MG/0.4ML IJ SOSY
40.0000 mg | PREFILLED_SYRINGE | INTRAMUSCULAR | Status: DC
  Administered 2024-06-13: 40 mg via SUBCUTANEOUS
  Filled 2024-06-12: qty 0.4

## 2024-06-12 MED ORDER — HYDROMORPHONE HCL 1 MG/ML IJ SOLN
0.2500 mg | INTRAMUSCULAR | Status: DC | PRN
  Administered 2024-06-12: 0.5 mg via INTRAVENOUS

## 2024-06-12 MED ORDER — LEVONORGESTREL 20 MCG/DAY IU IUD
1.0000 | INTRAUTERINE_SYSTEM | INTRAUTERINE | Status: DC
  Filled 2024-06-12: qty 1

## 2024-06-12 MED ORDER — LACTATED RINGERS IV SOLN: INTRAVENOUS | Status: DC | PRN

## 2024-06-12 MED ORDER — PROPOFOL 10 MG/ML IV BOLUS
INTRAVENOUS | Status: DC | PRN
  Administered 2024-06-12: 160 mg via INTRAVENOUS

## 2024-06-12 MED ORDER — CLINDAMYCIN PHOSPHATE 900 MG/50ML IV SOLN
900.0000 mg | INTRAVENOUS | Status: AC
  Administered 2024-06-12: 900 mg via INTRAVENOUS
  Filled 2024-06-12: qty 50

## 2024-06-12 MED ORDER — DEXAMETHASONE SOD PHOSPHATE PF 10 MG/ML IJ SOLN
4.0000 mg | INTRAMUSCULAR | Status: AC
  Administered 2024-06-12: 4 mg via INTRAVENOUS

## 2024-06-12 MED ORDER — OXYCODONE HCL 5 MG PO TABS: 5.0000 mg | ORAL_TABLET | ORAL | Status: DC | PRN

## 2024-06-12 MED ORDER — ONDANSETRON HCL 4 MG PO TABS: 4.0000 mg | ORAL_TABLET | Freq: Four times a day (QID) | ORAL | Status: DC | PRN

## 2024-06-12 MED ORDER — PROPOFOL 10 MG/ML IV BOLUS
INTRAVENOUS | Status: AC
  Filled 2024-06-12: qty 20

## 2024-06-12 MED ORDER — KETOROLAC TROMETHAMINE 15 MG/ML IJ SOLN
INTRAMUSCULAR | Status: DC | PRN
  Administered 2024-06-12: 15 mg via INTRAVENOUS

## 2024-06-12 MED ORDER — STERILE WATER FOR IRRIGATION IR SOLN
Status: DC | PRN
  Administered 2024-06-12: 1000 mL

## 2024-06-12 MED ORDER — LACTATED RINGERS IR SOLN
Status: DC | PRN
  Administered 2024-06-12: 1000 mL

## 2024-06-12 MED ORDER — ACETAMINOPHEN 500 MG PO TABS
1000.0000 mg | ORAL_TABLET | Freq: Four times a day (QID) | ORAL | Status: DC
  Administered 2024-06-12 – 2024-06-13 (×3): 1000 mg via ORAL
  Filled 2024-06-12 (×3): qty 2

## 2024-06-12 MED ORDER — SUGAMMADEX SODIUM 200 MG/2ML IV SOLN
INTRAVENOUS | Status: DC | PRN
  Administered 2024-06-12: 200 mg via INTRAVENOUS

## 2024-06-12 MED ORDER — SENNOSIDES-DOCUSATE SODIUM 8.6-50 MG PO TABS
2.0000 | ORAL_TABLET | Freq: Every day | ORAL | Status: DC
  Administered 2024-06-12: 2 via ORAL
  Filled 2024-06-12: qty 2

## 2024-06-12 MED ORDER — LEVOTHYROXINE SODIUM 100 MCG PO TABS
100.0000 ug | ORAL_TABLET | Freq: Every day | ORAL | Status: DC
  Administered 2024-06-13: 100 ug via ORAL
  Filled 2024-06-12: qty 1

## 2024-06-12 MED ORDER — MIDAZOLAM HCL 2 MG/2ML IJ SOLN
INTRAMUSCULAR | Status: AC
  Filled 2024-06-12: qty 2

## 2024-06-12 MED ORDER — ROCURONIUM BROMIDE 100 MG/10ML IV SOLN
INTRAVENOUS | Status: DC | PRN
  Administered 2024-06-12: 30 mg via INTRAVENOUS
  Administered 2024-06-12: 70 mg via INTRAVENOUS
  Administered 2024-06-12: 20 mg via INTRAVENOUS

## 2024-06-12 MED ORDER — ONDANSETRON HCL 4 MG/2ML IJ SOLN
INTRAMUSCULAR | Status: AC
  Filled 2024-06-12: qty 2

## 2024-06-12 MED ORDER — STERILE WATER FOR INJECTION IJ SOLN
INTRAMUSCULAR | Status: DC | PRN
Start: 2024-06-12 — End: 2024-06-12
  Administered 2024-06-12: 3 mL

## 2024-06-12 MED ORDER — CHLORHEXIDINE GLUCONATE 0.12 % MT SOLN
15.0000 mL | Freq: Once | OROMUCOSAL | Status: AC
  Administered 2024-06-12: 15 mL via OROMUCOSAL

## 2024-06-12 MED ORDER — HYDROMORPHONE HCL 1 MG/ML IJ SOLN
INTRAMUSCULAR | Status: AC
  Filled 2024-06-12: qty 1

## 2024-06-12 MED ORDER — LIDOCAINE HCL (CARDIAC) PF 100 MG/5ML IV SOSY
PREFILLED_SYRINGE | INTRAVENOUS | Status: DC | PRN
  Administered 2024-06-12: 100 mg via INTRAVENOUS

## 2024-06-12 MED ORDER — DROPERIDOL 2.5 MG/ML IJ SOLN: 0.6250 mg | Freq: Once | INTRAMUSCULAR | Status: DC | PRN

## 2024-06-12 MED ORDER — ONDANSETRON HCL 4 MG/2ML IJ SOLN
INTRAMUSCULAR | Status: DC | PRN
  Administered 2024-06-12: 4 mg via INTRAVENOUS

## 2024-06-12 MED ORDER — ONDANSETRON HCL 4 MG/2ML IJ SOLN: 4.0000 mg | Freq: Four times a day (QID) | INTRAMUSCULAR | Status: DC | PRN

## 2024-06-12 MED ORDER — ACETAMINOPHEN 500 MG PO TABS
1000.0000 mg | ORAL_TABLET | ORAL | Status: AC
  Administered 2024-06-12: 1000 mg via ORAL
  Filled 2024-06-12: qty 2

## 2024-06-12 MED ORDER — FENTANYL CITRATE (PF) 250 MCG/5ML IJ SOLN
INTRAMUSCULAR | Status: AC
  Filled 2024-06-12: qty 5

## 2024-06-12 MED ORDER — DEXMEDETOMIDINE HCL IN NACL 80 MCG/20ML IV SOLN
INTRAVENOUS | Status: DC | PRN
  Administered 2024-06-12: 8 ug via INTRAVENOUS

## 2024-06-12 SURGICAL SUPPLY — 85 items
APPLICATOR SURGIFLO ENDO (HEMOSTASIS) IMPLANT
BAG COUNTER SPONGE SURGICOUNT (BAG) ×3 IMPLANT
BAG LAPAROSCOPIC 12 15 PORT 16 (BASKET) ×1 IMPLANT
BLADE SURG SZ10 CARB STEEL (BLADE) IMPLANT
COVER BACK TABLE 60X90IN (DRAPES) ×3 IMPLANT
COVER TIP SHEARS 8 DVNC (MISCELLANEOUS) ×3 IMPLANT
DERMABOND ADVANCED .7 DNX12 (GAUZE/BANDAGES/DRESSINGS) ×3 IMPLANT
DEVICE MYOSURE LITE (MISCELLANEOUS) IMPLANT
DEVICE MYOSURE REACH (MISCELLANEOUS) IMPLANT
DILATOR CANAL MILEX (MISCELLANEOUS) IMPLANT
DRAPE ARM DVNC X/XI (DISPOSABLE) ×12 IMPLANT
DRAPE COLUMN DVNC XI (DISPOSABLE) ×3 IMPLANT
DRAPE SHEET LG 3/4 BI-LAMINATE (DRAPES) ×2 IMPLANT
DRAPE SURG IRRIG POUCH 19X23 (DRAPES) ×3 IMPLANT
DRIVER NDL MEGA SUTCUT DVNCXI (INSTRUMENTS) ×2 IMPLANT
DRIVER NDLE MEGA SUTCUT DVNCXI (INSTRUMENTS) ×3 IMPLANT
DRSG OPSITE POSTOP 4X6 (GAUZE/BANDAGES/DRESSINGS) IMPLANT
DRSG OPSITE POSTOP 4X8 (GAUZE/BANDAGES/DRESSINGS) IMPLANT
ELECT PENCIL ROCKER SW 15FT (MISCELLANEOUS) IMPLANT
ELECT REM PT RETURN 15FT ADLT (MISCELLANEOUS) ×3 IMPLANT
FORCEPS BPLR FENES DVNC XI (FORCEP) ×3 IMPLANT
FORCEPS PROGRASP DVNC XI (FORCEP) ×3 IMPLANT
GAUZE 4X4 16PLY ~~LOC~~+RFID DBL (SPONGE) ×6 IMPLANT
GLOVE BIO SURGEON STRL SZ 6 (GLOVE) ×12 IMPLANT
GLOVE BIO SURGEON STRL SZ 6.5 (GLOVE) ×3 IMPLANT
GOWN STRL REUS W/ TWL LRG LVL3 (GOWN DISPOSABLE) ×12 IMPLANT
GRASPER SUT TROCAR 14GX15 (MISCELLANEOUS) ×1 IMPLANT
HOLDER FOLEY CATH W/STRAP (MISCELLANEOUS) IMPLANT
IRRIGATION SUCT STRKRFLW 2 WTP (MISCELLANEOUS) ×3 IMPLANT
IV NS IRRIG 3000ML ARTHROMATIC (IV SOLUTION) ×3 IMPLANT
KIT PROCED FLUENT PRO FLT212S (KITS) IMPLANT
KIT PROCEDURE DVNC SI (MISCELLANEOUS) ×1 IMPLANT
KIT TURNOVER KIT A (KITS) ×3 IMPLANT
LIGASURE IMPACT 36 18CM CVD LR (INSTRUMENTS) IMPLANT
LOOP CUTTING BIPOLAR 21FR (ELECTRODE) IMPLANT
MANIPULATOR ADVINCU DEL 3.0 PL (MISCELLANEOUS) IMPLANT
MANIPULATOR ADVINCU DEL 3.5 PL (MISCELLANEOUS) IMPLANT
MANIPULATOR UTERINE 4.5 ZUMI (MISCELLANEOUS) ×1 IMPLANT
NDL HYPO 21X1.5 SAFETY (NEEDLE) ×2 IMPLANT
NDL SPNL 18GX3.5 QUINCKE PK (NEEDLE) IMPLANT
NEEDLE HYPO 21X1.5 SAFETY (NEEDLE) ×3 IMPLANT
NEEDLE SPNL 18GX3.5 QUINCKE PK (NEEDLE) ×3 IMPLANT
OBTURATOR OPTICALSTD 8 DVNC (TROCAR) ×3 IMPLANT
PACK ROBOT GYN CUSTOM WL (TRAY / TRAY PROCEDURE) ×3 IMPLANT
PACK VAGINAL WOMENS (CUSTOM PROCEDURE TRAY) ×3 IMPLANT
PAD OB MATERNITY 11 LF (PERSONAL CARE ITEMS) IMPLANT
PAD POSITIONING PINK XL (MISCELLANEOUS) ×3 IMPLANT
PORT ACCESS TROCAR AIRSEAL 12 (TROCAR) ×1 IMPLANT
SCISSORS LAP 5X45 EPIX DISP (ENDOMECHANICALS) IMPLANT
SCISSORS MNPLR CVD DVNC XI (INSTRUMENTS) ×3 IMPLANT
SCRUB CHG 4% DYNA-HEX 4OZ (MISCELLANEOUS) ×2 IMPLANT
SEAL ROD LENS SCOPE MYOSURE (ABLATOR) IMPLANT
SEAL UNIV 5-12 XI (MISCELLANEOUS) ×12 IMPLANT
SET TRI-LUMEN FLTR TB AIRSEAL (TUBING) ×3 IMPLANT
SPIKE FLUID TRANSFER (MISCELLANEOUS) ×3 IMPLANT
SPONGE T-LAP 18X18 ~~LOC~~+RFID (SPONGE) IMPLANT
SURGIFLO W/THROMBIN 8M KIT (HEMOSTASIS) IMPLANT
SUT MNCRL AB 4-0 PS2 18 (SUTURE) IMPLANT
SUT PDS AB 1 TP1 96 (SUTURE) IMPLANT
SUT STRATA PDS 0 30 CT-2.5 (SUTURE) IMPLANT
SUT STRATAFIX PDS+0 CT1 9 (SUTURE) ×1 IMPLANT
SUT V-LOC 180 0-0 GS22 (SUTURE) IMPLANT
SUT VIC AB 0 CT1 27XBRD ANTBC (SUTURE) ×1 IMPLANT
SUT VIC AB 2-0 CT1 TAPERPNT 27 (SUTURE) ×1 IMPLANT
SUT VIC AB 2-0 CT2 27 (SUTURE) ×1 IMPLANT
SUT VIC AB 2-0 SH 27X BRD (SUTURE) IMPLANT
SUT VIC AB 4-0 PS2 18 (SUTURE) ×6 IMPLANT
SUT VICRYL 0 27 CT2 27 ABS (SUTURE) ×3 IMPLANT
SUT VLOC 180 0 9IN GS21 (SUTURE) IMPLANT
SUTURE STRATFX 0 PDS+ CT-2 23 (SUTURE) IMPLANT
SYR 10ML LL (SYRINGE) ×1 IMPLANT
SYR BULB IRRIG 60ML STRL (SYRINGE) ×3 IMPLANT
SYSTEM BAG RETRIEVAL 10MM (BASKET) IMPLANT
SYSTEM RETRIEVL 5MM INZII UNIV (BASKET) IMPLANT
SYSTEM TISS REMOVAL MYOSURE XL (MISCELLANEOUS) IMPLANT
SYSTEM WOUND ALEXIS 18CM MED (MISCELLANEOUS) IMPLANT
TOWEL OR 17X26 10 PK STRL BLUE (TOWEL DISPOSABLE) ×3 IMPLANT
TRAP SPECIMEN MUCUS 40CC (MISCELLANEOUS) IMPLANT
TRAY FOLEY MTR SLVR 16FR STAT (SET/KITS/TRAYS/PACK) ×3 IMPLANT
TROCAR PORT AIRSEAL 5X120 (TROCAR) IMPLANT
TROCAR XCEL NON-BLD 5MMX100MML (ENDOMECHANICALS) ×3 IMPLANT
UNDERPAD 30X36 HEAVY ABSORB (UNDERPADS AND DIAPERS) ×6 IMPLANT
WATER STERILE IRR 1000ML POUR (IV SOLUTION) ×3 IMPLANT
WATER STERILE IRR 500ML POUR (IV SOLUTION) ×3 IMPLANT
YANKAUER SUCT BULB TIP 10FT TU (MISCELLANEOUS) IMPLANT

## 2024-06-12 NOTE — Anesthesia Postprocedure Evaluation (Signed)
 Anesthesia Post Note  Patient: [name]  Procedure(s) Performed: HYSTERECTOMY, TOTAL, ROBOT-ASSISTED, LAPAROSCOPIC, WITH BILATERAL SALPINGO-OOPHORECTOMY (Bilateral: Pelvis) INJECTION, FOR SENTINEL LYMPH NODE IDENTIFICATION (Pelvis)     Patient location during evaluation: PACU Anesthesia Type: General Level of consciousness: sedated and patient cooperative Pain management: pain level controlled Vital Signs Assessment: post-procedure vital signs reviewed and stable Respiratory status: spontaneous breathing Cardiovascular status: stable Anesthetic complications: no   No notable events documented.  Last Vitals:  Vitals:   06/12/24 1933 06/12/24 2022  BP: (!) 129/49 (!) 124/49  Pulse: 86 77  Resp: 15 15  Temp: 36.7 C 36.8 C  SpO2: 96% 97%    Last Pain:  Vitals:   06/12/24 2100  TempSrc:   PainSc: 2                  [name]

## 2024-06-12 NOTE — Plan of Care (Signed)
  Problem: Activity: Goal: Risk for activity intolerance will decrease Outcome: Progressing   Problem: Nutrition: Goal: Adequate nutrition will be maintained Outcome: Adequate for Discharge   Problem: Pain Managment: Goal: General experience of comfort will improve and/or be controlled Outcome: Progressing

## 2024-06-12 NOTE — Op Note (Signed)
 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 Obesity made retroperitoneal visualization limited and increased the complexity of the case and necessitated additional instrumentation for retraction. Obesity related complexity increased the duration of the procedure by 35 minutes.    Surgeon: Viktoria Crank MD  Assistant Surgeon: Eleanor Epps, NP (an NP assistant was necessary for tissue manipulation, management of robotic instrumentation, retraction and positioning due to the complexity of the case and hospital policies).   Anesthesia: GET  Urine Output: 700 cc  Operative Findings: On EUA, very narrow vaginal introitus, small mobile uterus. On intra-abdominal entry, normal upper abdominal survey. Normal omentum, small and large bowel. Significant adiposity of the pelvic retroperitoneum increasing difficulty with visualization of the pelvis requiring additional help with retraction. Somewhat redundant colon. Uterus 8 cm. Right tube and ovary normal in appearance. Left ovary with 2-3 cm cystic mass, some vesicular lesion on pelvic peritoneum on the left (looked most consistent with endosalpingiosis). No adenopathy. SLN mapping successful to bilateral external iliac lymph nodes. On frozen section, only EIN definitively seen.  Some bleeding from vaginal lacerations requiring repair, vaginal packing.  Estimated Blood Loss:  125 cc      Total IV Fluids: see I&O flowsheet         Specimens: uterus, cervix, bilateral tubes and ovaries         Complications:  None apparent; patient tolerated the procedure well.         Disposition: PACU - hemodynamically stable.  Procedure Details  The patient was seen in  the Holding Room. The risks, benefits, complications, treatment options, and expected outcomes were discussed with the patient.  The patient concurred with the proposed plan, giving informed consent.  The site of surgery properly noted/marked. The patient was identified as Brenda Christian and the procedure verified as a Robotic-assisted hysterectomy with bilateral salpingo oophorectomy with SLN biopsy.   After induction of anesthesia, the patient was draped and prepped in the usual sterile manner. Patient was placed in supine position after anesthesia and draped and prepped in the usual sterile manner as follows: Her arms were tucked to her side with all appropriate precautions. Bed extenders were used. The patient was secured to the bed using padding and tape across her chest.  The patient was placed in the semi-lithotomy position in [place] stirrups.  The perineum and vagina were prepped with CHG. The patient's abdomen was prepped with ChloraPrep and she was draped after the prep had been allowed to dry for 3 minutes.  A Time Out was held and the above information confirmed.  Tilt test performed and given good ability to ventilate, decision made to proceed.  The urethra was prepped with Betadine. Foley catheter was placed.  A sterile speculum was placed in the vagina.  The cervix was grasped with a single-tooth tenaculum. 2mg  total of ICG was injected into the cervical stroma at 2 and 9 o'clock with 1cc injected at a 1cm and 2mm depth (concentration 0.5mg /ml) in all locations. The cervix was dilated with Fredirick dilators.  The ZUMI uterine manipulator with a small colpotomizer ring was placed without difficulty.  A pneum occluder balloon was placed over the manipulator.  OG tube placement was confirmed and to suction.   Next, a 10 mm skin incision was made 1 cm below the  subcostal margin in the midclavicular line.  The 5 mm Optiview port and scope was used for direct entry.  Opening pressure was under 10 mm  CO2.  The abdomen was insufflated and the findings were noted as above.   At this point and all points during the procedure, the patient's intra-abdominal pressure did not exceed 15 mmHg. Patient was placed in Trendelenburg with good ability to ventilate. Next, an 8 mm skin incision was made superior to the umbilicus and a right and left port were placed about 8 cm lateral to the robot port on the right and left side.  A fourth arm was placed on the right.  The 5 mm assist trocar was exchanged for a 12 airseal mm port. All ports were placed under direct visualization.  The patient was placed in steep Trendelenburg.  Bowel was folded away into the upper abdomen.  The robot was docked in the normal manner.  The right and left peritoneum were opened parallel to the IP ligament to open the retroperitoneal spaces bilaterally. The round ligaments were transected. The SLN mapping was performed in bilateral pelvic basins. After identifying the ureters, the para rectal and paravesical spaces were opened up entirely with careful dissection below the level of the ureters bilaterally and to the depth of the uterine artery origin in order to skeletonize the uterine web and ensure visualization of all parametrial channels. The para-aortic basins were carefully exposed and evaluated for isolated para-aortic SLN's. Lymphatic channels were identified travelling to the following visualized sentinel lymph node's: bilateral external iliac SLNs. These SLN's were identified but not removed given plan to send the uterus for frozen section to determine need of SLN biopsy.  The hysterectomy was started.  The ureter was again noted to be on the medial leaf of the broad ligament.  The peritoneum above the ureter was incised and stretched and the infundibulopelvic ligament was skeletonized, cauterized and cut.  The posterior peritoneum was taken down to the level of the KOH ring.  The anterior peritoneum was also taken down.  The bladder  flap was created to the level of the KOH ring.  The uterine artery on the right side was skeletonized, cauterized and cut in the normal manner.  A similar procedure was performed on the left.  The colpotomy was made and the uterus, cervix, bilateral ovaries and tubes were amputated and delivered through the vagina.  Pedicles were inspected and excellent hemostasis was achieved.    The colpotomy at the vaginal cuff was closed with 0 Vicryl with a figure of eight at each apex and 0 Stratafix in two layers to close the midportion of the cuff in a running manner.  Irrigation was used and excellent hemostasis was achieved. Pressure was decreased in the abdomen to 5 mm Hg with excellent hemostasis maintained.  At this point in the procedure was completed.  Robotic instruments were removed under direct visulaization.  The robot was undocked. The fascia at the 10-12 mm port was closed with 0 Vicryl using a PMI fascial closure device under direct visualization.  The subcuticular tissue was closed with 4-0 Vicryl and the skin was closed with 4-0 Monocryl in a subcuticular manner.  Dermabond was applied.    Bleeding was noted from posterior distal laceration. This was made hemostatic with running locked 2-0 Vicryl. Some areas of raw tissue along the left vaginal sidewall was noted and decision made to pack the vagina for at least 1-2 hours in the PACu vs overnight depending  on patient's preference to stay or go home today once awake. Foley catheter was left in place.  All sponge, lap and needle counts were correct x  3.   The patient was transferred to the recovery room in stable condition.  Comer Dollar, MD

## 2024-06-12 NOTE — Anesthesia Procedure Notes (Signed)
 Procedure Name: Intubation Date/Time: 06/12/2024 10:49 AM  Performed by: Dartha Meckel, CRNAPre-anesthesia Checklist: Patient identified, Emergency Drugs available, Suction available and Patient being monitored Patient Re-evaluated:Patient Re-evaluated prior to induction Oxygen Delivery Method: Circle system utilized Preoxygenation: Pre-oxygenation with 100% oxygen Induction Type: IV induction Ventilation: Mask ventilation without difficulty Laryngoscope Size: Mac and 3 Grade View: Grade I Tube type: Oral Tube size: 7.0 mm Number of attempts: 1 Airway Equipment and Method: Stylet and Oral airway Placement Confirmation: ETT inserted through vocal cords under direct vision, positive ETCO2 and breath sounds checked- equal and bilateral Secured at: 21 cm Tube secured with: Tape Dental Injury: Teeth and Oropharynx as per pre-operative assessment

## 2024-06-12 NOTE — Plan of Care (Signed)

## 2024-06-12 NOTE — H&P (Signed)
 Gynecologic Oncology H&P  Treatment History: Patient initially presented with postmenopausal bleeding.  This for started in 2024 and was quite sporadic initially.  She was evaluated in August 2025.  Office ultrasound on 9/15 revealed a uterus measuring 7.3 x 3.2 x 4.2 cm with an endometrial lining measuring 12.2 mm.  Left ovary contained a simple cyst measuring 4.1 cm and another measuring 1.2 cm.  Both avascular.  Right ovary not visualized. 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   Patient presents today by herself.  She notes first episode of postmenopausal bleeding in 2024 lasting 3 days.  In May of this year, she began bleeding intermittently.  This will be bleeding heavily at times for up to 8 days using 3 overnight pads which are saturated.  She will then have episodes of spotting for up to 12 days, and other periods of no bleeding at all.  She has some cramping at the start of bleeding episodes, responds to Advil.  She has not had any bleeding since 9/20 after her biopsy.  Interval History: Doing well.  Past Medical/Surgical History: Past Medical History:  Diagnosis Date   Allergy    cats   Arthritis    knees   Asthma    allergy related   Depression    resolved   Deviated septum    Hypertension    Hypothyroidism    Thyroid  disease     Past Surgical History:  Procedure Laterality Date   OVARIAN CYST SURGERY  1995   TONSILLECTOMY     WISDOM TOOTH EXTRACTION      Family History  Problem Relation Age of Onset   Heart disease Mother    Diabetes Mother    Stroke Mother 44       CVA x 2   Hyperlipidemia Father    Stroke Maternal Grandmother    Mental retardation Maternal Grandmother    Cervical cancer Maternal Grandmother    Heart disease Maternal Grandfather    Cancer Paternal Grandmother    Cancer Paternal Grandfather    Heart disease Paternal Grandfather    Colon cancer Paternal Grandfather    Breast cancer Neg Hx     Prostate cancer Neg Hx    Ovarian cancer Neg Hx    Pancreatic cancer Neg Hx     Social History   Socioeconomic History   Marital status: Single    Spouse name: Not on file   Number of children: Not on file   Years of education: Not on file   Highest education level: Not on file  Occupational History   Not on file  Tobacco Use   Smoking status: Never   Smokeless tobacco: Never  Vaping Use   Vaping status: Never Used  Substance and Sexual Activity   Alcohol use: Yes    Comment: rarely   Drug use: No   Sexual activity: Not Currently  Other Topics Concern   Not on file  Social History Narrative   Marital status: single; not dating      Children: none       Lives: alone       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       Tobacco: none      Alcohol:  Rarely      Drugs; none      Exercise:  Tai Chi two days per week on Mondays and Wednesdays   Social Drivers of [place]  Resource Strain: Not on file  Food Insecurity: Not on file  Transportation Needs: Not on file  Physical Activity: Not on file  Stress: Not on file  Social Connections: Not on file    Current Medications:  Current Facility-Administered Medications:    clindamycin (CLEOCIN) IVPB 900 mg, 900 mg, Intravenous, On Call to OR **AND** ciprofloxacin (CIPRO) IVPB 400 mg, 400 mg, Intravenous, On Call to OR, Cross, Melissa D, NP   dexamethasone (DECADRON) injection 4 mg, 4 mg, Intravenous, On Call to OR, Cross, Melissa D, NP   lactated ringers infusion, , Intravenous, Continuous, Stoltzfus, Cordella SQUIBB, DO, Last Rate: 10 mL/hr at 06/12/24 0940, New Bag at 06/12/24 0940   levonorgestrel (MIRENA) 20 MCG/DAY IUD 1 each, 1 each, Intrauterine, To OR, Cross, Melissa D, NP  Review of Systems: + bleeding, fatigue, discharge Denies appetite changes, fevers, chills, unexplained weight changes. Denies hearing loss, neck lumps or masses, mouth sores, ringing in ears or voice changes. Denies  cough or wheezing.  Denies shortness of breath. Denies chest pain or palpitations. Denies leg swelling. Denies abdominal distention, pain, blood in stools, constipation, diarrhea, nausea, vomiting, or early satiety. Denies pain with intercourse, dysuria, frequency, hematuria or incontinence. Denies hot flashes, pelvic pain.   Denies joint pain, back pain or muscle pain/cramps. Denies itching, rash, or wounds. Denies dizziness, headaches, numbness or seizures. Denies swollen lymph nodes or glands, denies easy bruising or bleeding. Denies anxiety, depression, confusion, or decreased concentration.  Physical Exam: Ht 5' 3 (1.6 m)   Wt 265 lb (120.2 kg)   BMI 46.94 kg/m  Body mass index is 49.9 kg/m. General: Alert, oriented, no acute distress.  HEENT: Normocephalic, atraumatic. Sclera anicteric.  Chest: Clear to auscultation bilaterally. No wheezes, rhonchi, or rales. Cardiovascular: Regular rate and rhythm, no murmurs, rubs, or gallops.  Abdomen: Obese. Normoactive bowel sounds. Soft, nondistended, nontender to palpation. No masses or hepatosplenomegaly appreciated. No palpable fluid wave.  Extremities: Grossly normal range of motion. Warm, well perfused. No edema bilaterally.   Laboratory & Radiologic Studies:    Latest Ref Rng & Units 06/06/2024   10:27 AM 04/06/2018   12:24 PM 05/02/2017    7:01 PM  CBC  WBC 4.0 - 10.5 K/uL 5.4  5.9  8.8   Hemoglobin 12.0 - 15.0 g/dL 84.5  83.7  84.1   Hematocrit 36.0 - 46.0 % 44.6  45.5  44.3   Platelets 150 - 400 K/uL 280  256  283       Latest Ref Rng & Units 06/06/2024   10:27 AM 04/06/2018   12:24 PM 10/18/2017   12:29 PM  BMP  Glucose 70 - 99 mg/dL 98  90  84   BUN 8 - 23 mg/dL 11  15  13    Creatinine 0.44 - 1.00 mg/dL 9.31  9.27  9.26   BUN/Creat Ratio 12 - 28  21  18    Sodium 135 - 145 mmol/L 139  143  144   Potassium 3.5 - 5.1 mmol/L 4.4  4.5  4.8   Chloride 98 - 111 mmol/L 103  104  103   CO2 22 - 32 mmol/L 25  22  25    Calcium  8.9 - 10.3 mg/dL 9.7  9.4  9.2    Assessment & Plan: Brenda Christian is a 68 y.o. woman with with at least EIN.   Plan for definitive surgery (TRH/BSO, possible SLN biopsy). If does not tolerate Tilt test, plan for hysteroscopy, endometrial sampling and Mirena IUD  placement.  Comer Dollar, MD  Division of Gynecologic Oncology  Department of Obstetrics and Gynecology  [facility]

## 2024-06-12 NOTE — Transfer of Care (Signed)
 Immediate Anesthesia Transfer of Care Note  Patient: Brenda Christian  Procedure(s) Performed: HYSTERECTOMY, TOTAL, ROBOT-ASSISTED, LAPAROSCOPIC, WITH BILATERAL SALPINGO-OOPHORECTOMY (Bilateral: Pelvis) INJECTION, FOR SENTINEL LYMPH NODE IDENTIFICATION (Pelvis)  Patient Location: PACU  Anesthesia Type:General  Level of Consciousness: awake and alert   Airway & Oxygen Therapy: Patient Spontanous Breathing and Patient connected to nasal cannula oxygen  Post-op Assessment: Report given to RN and Post -op Vital signs reviewed and stable  Post vital signs: Reviewed and stable  Last Vitals:  Vitals Value Taken Time  BP 110/58 06/12/24 13:15  Temp    Pulse 56 06/12/24 13:18  Resp 17 06/12/24 13:18  SpO2 99 % 06/12/24 13:18  Vitals shown include unfiled device data.  Last Pain:  Vitals:   06/12/24 1005  TempSrc: Oral         Complications: No notable events documented.

## 2024-06-13 ENCOUNTER — Encounter (HOSPITAL_COMMUNITY): Payer: Self-pay | Admitting: Gynecologic Oncology

## 2024-06-13 DIAGNOSIS — C541 Malignant neoplasm of endometrium: Secondary | ICD-10-CM | POA: Diagnosis not present

## 2024-06-13 LAB — CBC
HCT: 38.6 % (ref 36.0–46.0)
Hemoglobin: 13.5 g/dL (ref 12.0–15.0)
MCH: 32.7 pg (ref 26.0–34.0)
MCHC: 35 g/dL (ref 30.0–36.0)
MCV: 93.5 fL (ref 80.0–100.0)
Platelets: 242 K/uL (ref 150–400)
RBC: 4.13 MIL/uL (ref 3.87–5.11)
RDW: 13.2 % (ref 11.5–15.5)
WBC: 11.7 K/uL — ABNORMAL HIGH (ref 4.0–10.5)
nRBC: 0 % (ref 0.0–0.2)

## 2024-06-13 LAB — BASIC METABOLIC PANEL WITH GFR
Anion gap: 10 (ref 5–15)
BUN: 9 mg/dL (ref 8–23)
CO2: 24 mmol/L (ref 22–32)
Calcium: 9 mg/dL (ref 8.9–10.3)
Chloride: 105 mmol/L (ref 98–111)
Creatinine, Ser: 0.64 mg/dL (ref 0.44–1.00)
GFR, Estimated: >60 mL/min (ref >60)
Glucose, Bld: 106 mg/dL — ABNORMAL HIGH (ref 70–99)
Potassium: 4.4 mmol/L (ref 3.5–5.1)
Sodium: 138 mmol/L (ref 135–145)

## 2024-06-13 NOTE — Discharge Summary (Signed)
 Physician Discharge Summary  Patient ID: Brenda Christian MRN: [id number] DOB/AGE: May 05, 1956 68 y.o.  Admit date: 06/12/2024 Discharge date: 06/13/2024  Admission Diagnoses: EIN (endometrial intraepithelial neoplasia)  Discharge Diagnoses:  Principal Problem:   EIN (endometrial intraepithelial neoplasia) Active Problems:   Endometrial intraepithelial neoplasia (EIN)   Discharged Condition:  The patient is in good condition and stable for discharge.    Hospital Course: On 06/12/2024, the patient underwent the following: Procedure(s): HYSTERECTOMY, TOTAL, ROBOT-ASSISTED, LAPAROSCOPIC, WITH BILATERAL SALPINGO-OOPHORECTOMY, INJECTION, FOR SENTINEL LYMPH NODE IDENTIFICATION.  The postoperative course was uneventful.  She was discharged to home on postoperative day 1 tolerating a regular diet, voiding after foley removal, minimal spotting after vaginal packing removal, passing flatus, pain controlled.  Consults: None  Significant Diagnostic Studies: Labs  Treatments: Surgery: see above  Discharge Exam: Blood pressure (!) 140/65, pulse 66, temperature (!) 97.4 F (36.3 C), temperature source Oral, resp. rate 16, height 5' 3 (1.6 m), weight 265 lb (120.2 kg), SpO2 97%. General appearance: alert, cooperative, appears stated age, and no distress Resp: clear to auscultation bilaterally Cardio: regular rate and rhythm, S1, S2 normal, no murmur, click, rub or gallop GI: soft, non-tender; bowel sounds normal; no masses,  no organomegaly Extremities: extremities normal, atraumatic, no cyanosis or edema Incision/Wound: Lap sites to the abdomen with dermabond intact with no active drainage.  Vaginal packing removed by Dr. Viktoria without difficulty. Minimal amount of older blood noted on packing. Foley removed by RN.  Disposition: Discharge disposition: 01-Home or Self Care       Discharge Instructions     Call MD for:  difficulty breathing, headache or visual disturbances    Complete by: As directed    Call MD for:  extreme fatigue   Complete by: As directed    Call MD for:  hives   Complete by: As directed    Call MD for:  persistant dizziness or light-headedness   Complete by: As directed    Call MD for:  persistant nausea and vomiting   Complete by: As directed    Call MD for:  redness, tenderness, or signs of infection (pain, swelling, redness, odor or green/yellow discharge around incision site)   Complete by: As directed    Call MD for:  severe uncontrolled pain   Complete by: As directed    Call MD for:  temperature >100.4   Complete by: As directed    Diet - low sodium heart healthy   Complete by: As directed    Driving Restrictions   Complete by: As directed    No driving for 4-89 days until the following has been met: Do not take narcotics and drive. You need to make sure your reaction time has returned.   Increase activity slowly   Complete by: As directed    Lifting restrictions   Complete by: As directed    No lifting greater than 10 lbs, pushing, pulling, straining for 6 weeks.   Sexual Activity Restrictions   Complete by: As directed    No sexual activity, nothing in the vagina, for 12 weeks.      Allergies as of 06/13/2024       Reactions   Penicillins Swelling   Third grade, severe swelling, mainly in lips   Egg Protein-containing Drug Products Other (See Comments)   Allergy test. Raw eggs    Wound Dressing Adhesive Dermatitis        Medication List     STOP taking these medications    medroxyPROGESTERone  10 MG tablet Commonly known as: Provera       TAKE these medications    ascorbic acid 1000 MG tablet Commonly known as: VITAMIN C Take 1,000 mg by mouth daily.   BLACK ELDERBERRY(BERRY-FLOWER) PO Take 1 tablet by mouth in the morning and at bedtime.   Cholecalciferol 50 MCG (2000 UT) Tabs Take 2,000 Units by mouth daily.   FISH OIL PO Take 1,400 mg by mouth daily.   GLUCOSAMINE-CHONDROITIN PO Take 1  tablet by mouth in the morning and at bedtime.   levothyroxine  100 MCG tablet Commonly known as: SYNTHROID  Take 1 tablet (100 mcg total) by mouth daily before breakfast.   liothyronine  25 MCG tablet Commonly known as: CYTOMEL  Take 0.5 tablets (12.5 mcg total) by mouth daily.   losartan  25 MG tablet Commonly known as: COZAAR  Take 1 tablet (25 mg total) by mouth 2 (two) times daily.   Magnesium 200 MG Tabs Take 200 mg by mouth daily.   NON FORMULARY Take 1 Scoop by mouth daily. Vinia Red Grape powder   OVER THE COUNTER MEDICATION Take 1 tablet by mouth daily. Immuno 150   PROBIOTIC PO Take 1 tablet by mouth daily.   senna-docusate 8.6-50 MG tablet Commonly known as: Senokot-S Take 2 tablets by mouth at bedtime. For AFTER surgery, do not take if having diarrhea   traMADol 50 MG tablet Commonly known as: ULTRAM Take 1 tablet (50 mg total) by mouth every 6 (six) hours as needed for moderate pain (pain score 4-6). For AFTER surgery only, do not take and drive   Vitamin D  (Ergocalciferol ) 1.25 MG (50000 UNIT) Caps capsule Commonly known as: DRISDOL Take 50,000 Units by mouth every 7 (seven) days.        Follow-up Information     Viktoria Comer SAUNDERS, MD Follow up on 06/19/2024.   Specialty: Gynecologic Oncology Why: Plan on having a PHONE visit with Dr. Viktoria around one week from surgery to check in and discuss pathology. IN PERSON visit will be on 07/11/24 at the [facility]. Contact information: [address] [place] [address] [phone]                 Greater than thirty minutes were spend for face to face discharge instructions and discharge orders/summary in EPIC.   Signed: Eleanor JONETTA Epps 06/13/2024, 10:27 AM

## 2024-06-13 NOTE — Progress Notes (Signed)
Pt was discharged home today. Instructions were reviewed with patient, and questions were answered. Pt was taken to main entrance via wheelchair by NT.  

## 2024-06-13 NOTE — Plan of Care (Signed)

## 2024-06-14 ENCOUNTER — Ambulatory Visit: Payer: Self-pay | Admitting: Gynecologic Oncology

## 2024-06-14 ENCOUNTER — Telehealth: Payer: Self-pay | Admitting: *Deleted

## 2024-06-14 NOTE — Telephone Encounter (Signed)
 Spoke with Brenda Christian this morning. She states she is eating, drinking and urinating well. She has  had a BM and is passing gas. She is taking senokot as prescribed and encouraged her to drink plenty of water. She denies fever or chills. Incisions are dry and intact. She rates her pain 3-4/10. Her pain is controlled with ibuprofen only.     Instructed to call office with any fever, chills, purulent drainage, uncontrolled pain or any other questions or concerns. Patient verbalizes understanding.   Pt aware of post op appointments as well as the office number [phone] and after hours number [phone] to call if she has any questions or concerns

## 2024-06-14 NOTE — Telephone Encounter (Signed)
 Attempted to reach patient for post op call. Left voicemail requesting call back to [phone].

## 2024-06-19 ENCOUNTER — Encounter: Payer: Self-pay | Admitting: Gynecologic Oncology

## 2024-06-19 ENCOUNTER — Inpatient Hospital Stay: Attending: Gynecologic Oncology | Admitting: Gynecologic Oncology

## 2024-06-19 DIAGNOSIS — Z90722 Acquired absence of ovaries, bilateral: Secondary | ICD-10-CM

## 2024-06-19 DIAGNOSIS — Z90710 Acquired absence of both cervix and uterus: Secondary | ICD-10-CM

## 2024-06-19 DIAGNOSIS — Z9079 Acquired absence of other genital organ(s): Secondary | ICD-10-CM

## 2024-06-19 DIAGNOSIS — Z7189 Other specified counseling: Secondary | ICD-10-CM

## 2024-06-19 DIAGNOSIS — C541 Malignant neoplasm of endometrium: Secondary | ICD-10-CM

## 2024-06-19 LAB — SURGICAL PATHOLOGY

## 2024-06-19 NOTE — Progress Notes (Signed)
 Gynecologic Oncology Telehealth Note: Gyn-Onc  I connected with Romero Seals on 06/19/24 at  6:00 PM EST by telephone and verified that I am speaking with the correct person using two identifiers.  I discussed the limitations, risks, security and privacy concerns of performing an evaluation and management service by telemedicine and the availability of in-person appointments. I also discussed with the patient that there may be a patient responsible charge related to this service. The patient expressed understanding and agreed to proceed.  Other persons participating in the visit and their role in the encounter: none.  Patient's location: home Provider's location: [facility]  Reason for Visit: follow-up  Treatment History: 06/12/24: Robotic-assisted laparoscopic total hysterectomy with bilateral salpingoophorectomy, SLN mapping without SLN biopsy   Interval History: Doing well. Some more pain at [facility] pain. Bowels moving well with sennakot.  Having light spotting. Voiding well.  Past Medical/Surgical History: Past Medical History:  Diagnosis Date   Allergy    cats   Arthritis    knees   Asthma    allergy related   Depression    resolved   Deviated septum    Hypertension    Hypothyroidism    Thyroid  disease     Past Surgical History:  Procedure Laterality Date   INJECTION, FOR SENTINEL LYMPH NODE IDENTIFICATION N/A 06/12/2024   Procedure: INJECTION, FOR SENTINEL LYMPH NODE IDENTIFICATION;  Surgeon: Viktoria Comer SAUNDERS, MD;  Location: [facility];  Service: Gynecology;  Laterality: N/A;   OVARIAN CYST SURGERY  1995   ROBOTIC ASSISTED TOTAL HYSTERECTOMY WITH BILATERAL SALPINGO OOPHERECTOMY Bilateral 06/12/2024   Procedure: HYSTERECTOMY, TOTAL, ROBOT-ASSISTED, LAPAROSCOPIC, WITH BILATERAL SALPINGO-OOPHORECTOMY;  Surgeon: Viktoria Comer SAUNDERS, MD;  Location: [facility];  Service: Gynecology;  Laterality: Bilateral;   TONSILLECTOMY     WISDOM TOOTH EXTRACTION      Family History   Problem Relation Age of Onset   Heart disease Mother    Diabetes Mother    Stroke Mother 20       CVA x 2   Hyperlipidemia Father    Stroke Maternal Grandmother    Mental retardation Maternal Grandmother    Cervical cancer Maternal Grandmother    Heart disease Maternal Grandfather    Cancer Paternal Grandmother    Cancer Paternal Grandfather    Heart disease Paternal Grandfather    Colon cancer Paternal Grandfather    Breast cancer Neg Hx    Prostate cancer Neg Hx    Ovarian cancer Neg Hx    Pancreatic cancer Neg Hx     Social History   Socioeconomic History   Marital status: Single    Spouse name: Not on file   Number of children: Not on file   Years of education: Not on file   Highest education level: Not on file  Occupational History   Not on file  Tobacco Use   Smoking status: Never   Smokeless tobacco: Never  Vaping Use   Vaping status: Never Used  Substance and Sexual Activity   Alcohol use: Yes    Comment: rarely   Drug use: No   Sexual activity: Not Currently  Other Topics Concern   Not on file  Social History Narrative   Marital status: single; not dating      Children: none       Lives: alone       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       Tobacco: none      Alcohol:  Rarely  Drugs; none      Exercise:  Tai Chi two days per week on Mondays and Wednesdays   Social Drivers of Health   Financial Resource Strain: Not on file  Food Insecurity: Unknown (06/12/2024)   Hunger Vital Sign    Worried About Running Out of Food in the Last Year: Patient declined    Ran Out of Food in the Last Year: Never true  Transportation Needs: No Transportation Needs (06/12/2024)   PRAPARE - Administrator, Civil Service (Medical): No    Lack of Transportation (Non-Medical): No  Physical Activity: Not on file  Stress: Not on file  Social Connections: Unknown (06/12/2024)   Social Connection and Isolation Panel     Frequency of Communication with Friends and Family: Three times a week    Frequency of Social Gatherings with Friends and Family: Three times a week    Attends Religious Services: More than 4 times per year    Active Member of Clubs or Organizations: Yes    Attends Banker Meetings: More than 4 times per year    Marital Status: Patient declined    Current Medications:  Current Outpatient Medications:    ascorbic acid (VITAMIN C) 1000 MG tablet, Take 1,000 mg by mouth daily., Disp: , Rfl:    BLACK ELDERBERRY,BERRY-FLOWER, PO, Take 1 tablet by mouth in the morning and at bedtime., Disp: , Rfl:    Cholecalciferol 50 MCG (2000 UT) TABS, Take 2,000 Units by mouth daily., Disp: , Rfl:    GLUCOSAMINE-CHONDROITIN PO, Take 1 tablet by mouth in the morning and at bedtime., Disp: , Rfl:    levothyroxine  (SYNTHROID , LEVOTHROID) 100 MCG tablet, Take 1 tablet (100 mcg total) by mouth daily before breakfast., Disp: 90 tablet, Rfl: 1   liothyronine  (CYTOMEL ) 25 MCG tablet, Take 0.5 tablets (12.5 mcg total) by mouth daily., Disp: 90 tablet, Rfl: 1   losartan  (COZAAR ) 25 MG tablet, Take 1 tablet (25 mg total) by mouth 2 (two) times daily., Disp: 180 tablet, Rfl: 1   Magnesium 200 MG TABS, Take 200 mg by mouth daily., Disp: , Rfl:    NON FORMULARY, Take 1 Scoop by mouth daily. Vinia Red Grape powder, Disp: , Rfl:    Omega-3 Fatty Acids (FISH OIL PO), Take 1,400 mg by mouth daily., Disp: , Rfl:    OVER THE COUNTER MEDICATION, Take 1 tablet by mouth daily. Immuno 150, Disp: , Rfl:    Probiotic Product (PROBIOTIC PO), Take 1 tablet by mouth daily., Disp: , Rfl:    senna-docusate (SENOKOT-S) 8.6-50 MG tablet, Take 2 tablets by mouth at bedtime. For AFTER surgery, do not take if having diarrhea, Disp: 30 tablet, Rfl: 0   traMADol  (ULTRAM ) 50 MG tablet, Take 1 tablet (50 mg total) by mouth every 6 (six) hours as needed for moderate pain (pain score 4-6). For AFTER surgery only, do not take and drive,  Disp: 10 tablet, Rfl: 0   Vitamin D , Ergocalciferol , (DRISDOL) 50000 units CAPS capsule, Take 50,000 Units by mouth every 7 (seven) days., Disp: , Rfl:   Review of Symptoms: Pertinent positives as per HPI.  Physical Exam: Deferred given limitations of phone visit.  Laboratory & Radiologic Studies: A. UTERUS, CERVIX, FALLOPIAN TUBE, OVARY, BILATERAL; TOTAL HYSTERECTOMY WITH BILATERAL SALPINGO-OOPHORECTOMY: - Endometrioid carcinoma. - Endometrioid intraepithelial neoplasia involving adenomyosis. - See cancer summary and comment below. - Background inactive endometrium. - Myometrium with adenomyosis and leiomyomata. - Cervix with Nabothian cysts. - Serosal endometriosis. - Bilateral  fallopian tubes with fimbriated end. - Bilateral ovaries with cortical inclusion cysts.  CASE SUMMARY: (ENDOMETRIUM) Standard(s): AJCC 8, FIGO 2009 Staging (2018 Annual Report), FIGO 2023 Staging  SPECIMEN Procedure: Total hysterectomy with bilateral salpingo-oophorectomy  TUMOR Histologic Type: Endometrioid carcinoma Histologic Grade: FIGO grade 1 Molecular Type:      MMR Immunohistochemistry: Will be reported in an addendum      p53 Immunohistochemistry: Will be reported in an addendum Myometrial Invasion: Present; percentage of myometrial invasion cannot be determined Uterine Serosa Involvement: Not identified Cervical Involvement: Not identified Other Tissue/Organ Involvement: Not identified Lymphatic and/or Vascular Invasion: Not identified  MARGINS Margin Status: Not applicable  REGIONAL LYMPH NODES Regional Lymph Node Status: Not applicable (no regional lymph nodes submitted or found)  DISTANT METASTASIS Distant Site(s) Involved, if applicable: Not applicable  Assessment & Plan: Brenda Christian is a 68 y.o. 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 P53. MMRp. Challenging to decipher myometrial invasion as this was not  identified grossly.  A full-thickness section of the myometrium was not submitted in the area of the deepest myometrial invasion of 7 mm.  In other sections, myometrial wall measures 13-15 mm.  Doing well meeting postoperative milestones.  Discussed continued expectations and restrictions.  Discussed pathology with her.  Reviewed likely stage Ib although myometrial invasion was difficult to estimate given the area of tumor was cut through as it was not grossly seen.  Discussed plan for NGS.  Discussed likely recommendation for observation but may consider vaginal brachytherapy give one HIR criteria and age over 40.  I discussed the assessment and treatment plan with the patient. The patient was provided with an opportunity to ask questions and all were answered. The patient agreed with the plan and demonstrated an understanding of the instructions.   The patient was advised to call back or see an in-person evaluation if the symptoms worsen or if the condition fails to improve as anticipated.   16 minutes of total time was spent for this patient encounter, including preparation, phone counseling with the patient and coordination of care, and documentation of the encounter.   Comer Dollar, MD  Division of Gynecologic Oncology  Department of Obstetrics and Gynecology  [facility]

## 2024-06-21 ENCOUNTER — Encounter: Payer: Self-pay | Admitting: Oncology

## 2024-06-21 NOTE — Progress Notes (Signed)
 Sent order requisition to [facility] on accession [id number] per Dr. Viktoria.

## 2024-06-24 ENCOUNTER — Other Ambulatory Visit: Payer: Self-pay | Admitting: Oncology

## 2024-06-24 ENCOUNTER — Encounter: Payer: Self-pay | Admitting: Genetic Counselor

## 2024-06-24 NOTE — Progress Notes (Signed)
 Gynecologic Oncology Multi-Disciplinary Disposition Conference Note  Date of the Conference: 06/24/2024  Patient Name: Brenda Christian  Referring Provider: Dr. Rosalva Primary GYN Oncologist: Dr. 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 Disposition is to surgery to remove lymph nodes vs imaging then consideration for vaginal brachytherapy or whole pelvic radiation with or without vaginal brachytherapy.   This Multidisciplinary conference took place involving physicians from Gynecologic Oncology, Medical Oncology, Radiation Oncology, Pathology, Radiology along with the Gynecologic Oncology Nurse Practitioner and Gynecologic Oncology Nurse Navigator.  Comprehensive assessment of the patient's malignancy, staging, need for surgery, chemotherapy, radiation therapy, and need for further testing were reviewed. Supportive measures, both inpatient and following discharge were also discussed. The recommended plan of care is documented. Greater than 35 minutes were spent correlating and coordinating this patient's care.

## 2024-06-25 ENCOUNTER — Encounter: Payer: Self-pay | Admitting: Oncology

## 2024-06-25 DIAGNOSIS — C541 Malignant neoplasm of endometrium: Secondary | ICD-10-CM

## 2024-06-25 NOTE — Progress Notes (Signed)
 Called Brenda Christian and advised her that Dr. Viktoria has referred her to Dr. Shannon with Radiation Oncology and also has ordered a CT scan.  Sehaj said she has not decided on radiation yet and would like to see Dr. Viktoria first.  Advised I will let the schedulers know to schedule the apt after she sees Dr. Viktoria on 07/11/24.  Also let her know about the CT appointment at [facility] on 07/04/24 with arrival at 1:00 to drink the oral contrast.  Advised her about NPO for 4 hours prior.

## 2024-07-02 ENCOUNTER — Encounter: Payer: Self-pay | Admitting: Gynecologic Oncology

## 2024-07-03 ENCOUNTER — Other Ambulatory Visit (HOSPITAL_COMMUNITY)

## 2024-07-04 ENCOUNTER — Ambulatory Visit (HOSPITAL_COMMUNITY)
Admission: RE | Admit: 2024-07-04 | Discharge: 2024-07-04 | Attending: Gynecologic Oncology | Admitting: Gynecologic Oncology

## 2024-07-04 DIAGNOSIS — C541 Malignant neoplasm of endometrium: Secondary | ICD-10-CM | POA: Diagnosis not present

## 2024-07-04 MED ORDER — IOHEXOL 9 MG/ML PO SOLN
500.0000 mL | ORAL | Status: AC
  Administered 2024-07-04 (×2): 500 mL via ORAL

## 2024-07-04 MED ORDER — IOHEXOL 300 MG/ML  SOLN
100.0000 mL | Freq: Once | INTRAMUSCULAR | Status: AC | PRN
  Administered 2024-07-04: 100 mL via INTRAVENOUS

## 2024-07-04 MED ORDER — SODIUM CHLORIDE (PF) 0.9 % IJ SOLN
INTRAMUSCULAR | Status: AC
  Filled 2024-07-04: qty 50

## 2024-07-11 ENCOUNTER — Ambulatory Visit: Payer: Self-pay | Admitting: Gynecologic Oncology

## 2024-07-11 ENCOUNTER — Inpatient Hospital Stay: Attending: Gynecologic Oncology | Admitting: Gynecologic Oncology

## 2024-07-11 ENCOUNTER — Encounter: Payer: Self-pay | Admitting: Gynecologic Oncology

## 2024-07-11 VITALS — BP 143/68 | HR 79 | Temp 99.0°F | Resp 19 | Wt 262.4 lb

## 2024-07-11 DIAGNOSIS — Z7189 Other specified counseling: Secondary | ICD-10-CM

## 2024-07-11 DIAGNOSIS — C541 Malignant neoplasm of endometrium: Secondary | ICD-10-CM

## 2024-07-11 NOTE — Progress Notes (Signed)
 Gynecologic Oncology Return Clinic Visit  07/11/2024  Reason for Visit: follow-up, treatment planning  Treatment History: Patient initially presented with postmenopausal bleeding.  This for started in 2024 and was quite sporadic initially.  She was evaluated in August 2025.  Office ultrasound on 9/15 revealed a uterus measuring 7.3 x 3.2 x 4.2 cm with an endometrial lining measuring 12.2 mm.  Left ovary contained a simple cyst measuring 4.1 cm and another measuring 1.2 cm.  Both avascular.  Right ovary not visualized. 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 06/12/24: Robotic-assisted laparoscopic total hysterectomy with bilateral salpingoophorectomy, SLN mapping without SLN biopsy   Interval History: Overall doing well.  Denies any significant abdominal or pelvic pain.  Continues to have some vaginal spotting.  Endorses good bowel function with the use of Senokot.  Denies urinary symptoms.  Past Medical/Surgical History: Past Medical History:  Diagnosis Date   Allergy    cats   Arthritis    knees   Asthma    allergy related   Depression    resolved   Deviated septum    Hypertension    Hypothyroidism    Thyroid  disease     Past Surgical History:  Procedure Laterality Date   INJECTION, FOR SENTINEL LYMPH NODE IDENTIFICATION N/A 06/12/2024   Procedure: INJECTION, FOR SENTINEL LYMPH NODE IDENTIFICATION;  Surgeon: Viktoria Comer SAUNDERS, MD;  Location: [facility];  Service: Gynecology;  Laterality: N/A;   OVARIAN CYST SURGERY  1995   ROBOTIC ASSISTED TOTAL HYSTERECTOMY WITH BILATERAL SALPINGO OOPHERECTOMY Bilateral 06/12/2024   Procedure: HYSTERECTOMY, TOTAL, ROBOT-ASSISTED, LAPAROSCOPIC, WITH BILATERAL SALPINGO-OOPHORECTOMY;  Surgeon: Viktoria Comer SAUNDERS, MD;  Location: [facility];  Service: Gynecology;  Laterality: Bilateral;   TONSILLECTOMY     WISDOM TOOTH EXTRACTION      Family History  Problem Relation Age of Onset   Heart disease Mother     Diabetes Mother    Stroke Mother 34       CVA x 2   Hyperlipidemia Father    Stroke Maternal Grandmother    Mental retardation Maternal Grandmother    Cervical cancer Maternal Grandmother    Heart disease Maternal Grandfather    Cancer Paternal Grandmother    Cancer Paternal Grandfather    Heart disease Paternal Grandfather    Colon cancer Paternal Grandfather    Breast cancer Neg Hx    Prostate cancer Neg Hx    Ovarian cancer Neg Hx    Pancreatic cancer Neg Hx     Social History   Socioeconomic History   Marital status: Single    Spouse name: Not on file   Number of children: Not on file   Years of education: Not on file   Highest education level: Not on file  Occupational History   Not on file  Tobacco Use   Smoking status: Never   Smokeless tobacco: Never  Vaping Use   Vaping status: Never Used  Substance and Sexual Activity   Alcohol use: Yes    Comment: rarely   Drug use: No   Sexual activity: Not Currently  Other Topics Concern   Not on file  Social History Narrative   Marital status: single; not dating      Children: none       Lives: alone       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       Tobacco: none      Alcohol:  Rarely  Drugs; none      Exercise:  Tai Chi two days per week on Mondays and Wednesdays   Social Drivers of Health   Tobacco Use: Low Risk (07/11/2024)   Patient History    Smoking Tobacco Use: Never    Smokeless Tobacco Use: Never    Passive Exposure: Not on file  Financial Resource Strain: Not on file  Food Insecurity: Unknown (06/12/2024)   Epic    Worried About Programme Researcher, Broadcasting/film/video in the Last Year: Patient declined    Barista in the Last Year: Never true  Transportation Needs: No Transportation Needs (06/12/2024)   Epic    Lack of Transportation (Medical): No    Lack of Transportation (Non-Medical): No  Physical Activity: Not on file  Stress: Not on file  Social Connections: Unknown  (06/12/2024)   Social Connection and Isolation Panel    Frequency of Communication with Friends and Family: Three times a week    Frequency of Social Gatherings with Friends and Family: Three times a week    Attends Religious Services: More than 4 times per year    Active Member of Clubs or Organizations: Yes    Attends Banker Meetings: More than 4 times per year    Marital Status: Patient declined  Depression (PHQ2-9): Not on file  Alcohol Screen: Not on file  Housing: Low Risk (06/12/2024)   Epic    Unable to Pay for Housing in the Last Year: No    Number of Times Moved in the Last Year: 0    Homeless in the Last Year: No  Utilities: Not At Risk (06/12/2024)   Epic    Threatened with loss of utilities: No  Health Literacy: Not on file    Current Medications: Current Medications[1]  Review of Systems: + Fatigue, cough mostly at night for the last week, spotting, joint pain Denies appetite changes, fevers, chills, unexplained weight changes. Denies hearing loss, neck lumps or masses, mouth sores, ringing in ears or voice changes. Denies wheezing.  Denies shortness of breath. Denies chest pain or palpitations. Denies leg swelling. Denies abdominal distention, pain, blood in stools, constipation, diarrhea, nausea, vomiting, or early satiety. Denies pain with intercourse, dysuria, frequency, hematuria or incontinence. Denies hot flashes, pelvic pain or vaginal discharge.   Denies back pain or muscle pain/cramps. Denies itching, rash, or wounds. Denies dizziness, headaches, numbness or seizures. Denies swollen lymph nodes or glands, denies easy bruising or bleeding. Denies anxiety, depression, confusion, or decreased concentration.  Physical Exam: BP (!) 143/68 (BP Location: Left Arm, Patient Position: Sitting)   Pulse 79   Temp 99 F (37.2 C) (Oral)   Resp 19   Wt 262 lb 6.4 oz (119 kg)   SpO2 99%   BMI 46.48 kg/m  General: Alert, oriented, no acute  distress. HEENT: Posterior oropharynx clear, sclera anicteric. Chest: Clear to auscultation bilaterally.  No wheezes or rhonchi. Cardiovascular: Regular rate and rhythm, no murmurs. Abdomen: Obese, soft, nontender.  Normoactive bowel sounds.  No masses or hepatosplenomegaly appreciated.  Well-healed incisions. Extremities: Grossly normal range of motion.  Warm, well perfused.  No edema bilaterally. GU: Normal appearing external genitalia without erythema, excoriation, or lesions.  Speculum exam reveals mildly atrophic vaginal mucosa.  Cuff is intact with suture visible.  There are 2 areas along bilateral vaginal sidewalls at the mid vagina that moves with opening the speculum, both treated with silver nitrate.  Bimanual exam reveals cuff intact, no fluctuance or tenderness to  palpation.    Laboratory & Radiologic Studies: A. UTERUS, CERVIX, FALLOPIAN TUBE, OVARY, BILATERAL; TOTAL HYSTERECTOMY WITH BILATERAL SALPINGO-OOPHORECTOMY: - Endometrioid carcinoma. - Endometrioid intraepithelial neoplasia involving adenomyosis. - See cancer summary and comment below. - Background inactive endometrium. - Myometrium with adenomyosis and leiomyomata. - Cervix with Nabothian cysts. - Serosal endometriosis. - Bilateral fallopian tubes with fimbriated end. - Bilateral ovaries with cortical inclusion cysts.  CASE SUMMARY: (ENDOMETRIUM) Standard(s): AJCC 8, FIGO 2009 Staging (2018 Annual Report), FIGO 2023 Staging  SPECIMEN Procedure: Total hysterectomy with bilateral salpingo-oophorectomy  TUMOR Histologic Type: Endometrioid carcinoma Histologic Grade: FIGO grade 1 Molecular Type:      MMR Immunohistochemistry: Will be reported in an addendum      p53 Immunohistochemistry: Will be reported in an addendum Myometrial Invasion: Present; percentage of myometrial invasion cannot be determined Uterine Serosa Involvement: Not identified Cervical Involvement: Not identified Other Tissue/Organ  Involvement: Not identified Lymphatic and/or Vascular Invasion: Not identified  MARGINS Margin Status: Not applicable  REGIONAL LYMPH NODES Regional Lymph Node Status: Not applicable (no regional lymph nodes submitted or found)  DISTANT METASTASIS Distant Site(s) Involved, if applicable: Not applicable   CT C/A/P on 07/04/24: 1. Status post hysterectomy and oophorectomy. 2. No evidence of lymphadenopathy or metastatic disease in the chest, abdomen, or pelvis. 3. Enlargement of the main pulmonary artery, as can be seen in pulmonary hypertension.  Assessment & Plan: Brenda Christian is a 68 y.o. 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 P53. MMRp. Challenging to decipher myometrial invasion as this was not identified grossly.  A full-thickness section of the myometrium was not submitted in the area of the deepest myometrial invasion of 7 mm.  In other sections, myometrial wall measures 13-15 mm. CARIS: MSS, MMRp, ER/PR 2+, mutations in ARID1A, PIK3R1, PTEN. No p53 or POLE mutation. HER2 1+.  Patient is overall doing well postoperatively.  Discussed continued expectations and restrictions.  2 areas within the vagina that I suspect are the cause of her spotting were treated with silver nitrate today.  We spent quite some time reviewing pathology from surgery.  She was given a copy of her pathology report.  Discussed her next generation sequencing.  Also discussed recent CT scan which showed no evidence of adenopathy or distant metastatic disease.  Reviewed again difficulty in determining myometrial invasion and met it was likely right around 50%.  Discussed options of second surgery for lymphadenectomy/staging.  Also discussed use of uterine factors to drive adjuvant radiation recommendations.  Reviewed more conservative approach which would be to treat with vaginal brachytherapy presuming that her myometrial invasion is at most 50%.  She  voices significant concern about radiation.  We discussed vaginal brachytherapy itself and the difference from pelvic radiation.  I encouraged her to keep her visit with radiation oncology on Monday before she makes a decision.  She seems somewhat inclined to proceed without adjuvant therapy given her concerns about radiation and desire to avoid a second surgery.  We discussed the pattern of recurrence for this type of cancer and that if she were to recur locally at the vaginal cuff, this is very frequently salvageable.  In terms of follow-up plan, we discussed initial follow-up for review of symptoms and exam every 3-6 months.  We will tentatively make her an appointment with me in 3 months such that we have something scheduled if she opts not to proceed with any adjuvant therapy.  40 minutes of total time was spent for this patient  encounter, including preparation, face-to-face counseling with the patient and coordination of care, and documentation of the encounter.  Comer Dollar, MD  Division of Gynecologic Oncology  Department of Obstetrics and Gynecology  [facility]      [1]  Current Outpatient Medications:    ascorbic acid (VITAMIN C) 1000 MG tablet, Take 1,000 mg by mouth daily., Disp: , Rfl:    BLACK ELDERBERRY,BERRY-FLOWER, PO, Take 1 tablet by mouth in the morning and at bedtime., Disp: , Rfl:    Cholecalciferol 50 MCG (2000 UT) TABS, Take 2,000 Units by mouth daily., Disp: , Rfl:    GLUCOSAMINE-CHONDROITIN PO, Take 1 tablet by mouth in the morning and at bedtime., Disp: , Rfl:    levothyroxine  (SYNTHROID , LEVOTHROID) 100 MCG tablet, Take 1 tablet (100 mcg total) by mouth daily before breakfast., Disp: 90 tablet, Rfl: 1   liothyronine  (CYTOMEL ) 25 MCG tablet, Take 0.5 tablets (12.5 mcg total) by mouth daily., Disp: 90 tablet, Rfl: 1   losartan  (COZAAR ) 25 MG tablet, Take 1 tablet (25 mg total) by mouth 2 (two) times daily., Disp: 180 tablet, Rfl: 1    Magnesium 200 MG TABS, Take 200 mg by mouth daily., Disp: , Rfl:    NON FORMULARY, Take 1 Scoop by mouth daily. Vinia Red Grape powder, Disp: , Rfl:    Omega-3 Fatty Acids (FISH OIL PO), Take 1,400 mg by mouth daily., Disp: , Rfl:    OVER THE COUNTER MEDICATION, Take 1 tablet by mouth daily. Immuno 150, Disp: , Rfl:    Probiotic Product (PROBIOTIC PO), Take 1 tablet by mouth daily., Disp: , Rfl:    senna-docusate (SENOKOT-S) 8.6-50 MG tablet, Take 2 tablets by mouth at bedtime. For AFTER surgery, do not take if having diarrhea, Disp: 30 tablet, Rfl: 0   Vitamin D , Ergocalciferol , (DRISDOL) 50000 units CAPS capsule, Take 50,000 Units by mouth every 7 (seven) days., Disp: , Rfl:    traMADol  (ULTRAM ) 50 MG tablet, Take 1 tablet (50 mg total) by mouth every 6 (six) hours as needed for moderate pain (pain score 4-6). For AFTER surgery only, do not take and drive (Patient not taking: Reported on 07/10/2024), Disp: 10 tablet, Rfl: 0

## 2024-07-11 NOTE — Patient Instructions (Signed)
 It was good to see you today.  You are healing well from surgery.  Please remember, nothing in the vagina for 12 weeks.  Please send the message after you meet with Dr. Shannon next week.  If you ultimately decide against radiation, the office has scheduled you for a 5-month follow-up visit with me.  Between visits, if you were to start having new vaginal bleeding, pelvic pain, change to bowel or bladder function, or any other concerning symptoms, please call to come in sooner.

## 2024-07-12 NOTE — Progress Notes (Signed)
 GYN Location of Tumor / Histology: Endometrial  Romero Seals presented with symptoms of: bleeding  Biopsies revealed:    Past/Anticipated interventions by Gyn/Onc surgery, if any:    Past/Anticipated interventions by medical oncology, if any:   Weight changes, if any: yes, 15 lbs since starting weight loss plan.   Bowel/Bladder complaints, if any: No.,   Nausea/Vomiting, if any: no  Pain issues, if any:   Reports having some discomfort after seeing Dr. Viktoria and having silver nitrate due to continuing to have some bleeding.   SAFETY ISSUES: Prior radiation? no Pacemaker/ICD? no Possible current pregnancy? no Is the patient on methotrexate? no  Current Complaints / other details:    BP (!) 160/65 (BP Location: Left Arm, Patient Position: Sitting)   Pulse 81   Temp (!) 96.9 F (36.1 C) (Temporal)   Resp 18   Ht 5' 3 (1.6 m)   Wt 264 lb 8 oz (120 kg)   SpO2 98%   BMI 46.85 kg/m

## 2024-07-14 NOTE — Progress Notes (Signed)
 Radiation Oncology         [phone] ________________________________  Initial Outpatient Consultation  Name: Brenda Christian MRN: [id number]  Date: 07/15/2024  DOB: 1955-12-01  CC:Sun, Vyvyan, MD  Viktoria Comer SAUNDERS, MD   REFERRING PHYSICIAN: Viktoria Comer SAUNDERS, MD  DIAGNOSIS: There were no encounter diagnoses.  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 female who is accompanied by ***. she is seen as a courtesy of Dr. Viktoria for an opinion concerning radiation therapy as part of management for her recently diagnosed endometrial cancer.   The patient initially developed sporadic postmenopausal bleeding starting in 2024. Her bleeding became more intermittent in nature starting this past May, and progressed to the point where she would have episodes of heavy bleeding for up to 8 days (and requiring up to 3 overnight pads which would be completely saturated). Her symptoms continued and she presented to medical attention in August of 2025 to pursue further evaluation.   Her initial imaging work-up consisted of a pelvic ultrasound on 04/15/24 which revealed a uterus measuring 7.3 x 3.2 x 4.2 cm, an endometrial lining measuring 12.2 mm, and a simple cyst measuring 4.1 cm in the left ovary as well as another left ovarian cyst measuring 1.2 cm (both of which appearing avascular). The right ovary was not visualized.   An endometrial biopsy was promptly obtained that same day which showed findings concerning for FIGO grade 1 endometrial adenocarcinoma.   She was accordingly referred to Dr. Viktoria on 05/03/24 for further evaluation and management. During that visit, the patient endorsed some increased fatigue over the last several months. She denied any other symptoms or concerns other than her bleeding and fatigue.  Based on the recommendations of Dr. Viktoria, she opted to proceed with a total hysterectomy and BSO  on 06/12/24. Pathology from the procedure revealed: endometrioid carcinoma (w/ endometrioid intraepithelial neoplasia involving adenomyosis along with a background o inactive endometrium, and serosal endometriosis); p53 wild-type; MMR normal; myometrial invasion present (however extent of myometrial invasion could not be determined). Bilateral ovaries and fallopian tubes negative for carcinoma. CARIS molecular testing was also obtained; results showed: SS, MMRp, ER/PR 2+, mutations in ARID1A, PIK3R1, PTEN. No p53 or POLE mutation. HER2 1+.   She did report some very mild vaginal spotting during her most recent visit with Dr. Viktoria on 07/11/24. GU exam performed noted 2 areas in the vagina that were suspected to be related to her spotting. She was given sliver nitrate to apply to these areas.   With regards to adjuvant treatment, Dr. Viktoria presented her case at the gyn-onc tumor board on 06/24/24. Disposition concluded at that time is to surgery to remove lymph nodes vs imaging followed by consideration of vaginal brachytherapy or whole pelvic radiation therapy, with or without vaginal brachytherapy.   Other pertinent imaging performed thus far includes a CT CAP on 07/04/24 which thankfully showed no evidence of lymphadenopathy or metastatic disease in the chest, abdomen, or pelvis. Other findings of potential clinical significance included enlargement of the main pulmonary artery which is typically indicative of pulmonary hypertension.   PREVIOUS RADIATION THERAPY: No  PAST MEDICAL HISTORY:  Past Medical History:  Diagnosis Date   Allergy    cats   Arthritis    knees   Asthma    allergy related   Depression    resolved   Deviated septum    Hypertension    Hypothyroidism    Thyroid   disease     PAST SURGICAL HISTORY: Past Surgical History:  Procedure Laterality Date   INJECTION, FOR SENTINEL LYMPH NODE IDENTIFICATION N/A 06/12/2024   Procedure: INJECTION, FOR SENTINEL LYMPH NODE  IDENTIFICATION;  Surgeon: Viktoria Comer SAUNDERS, MD;  Location: [facility];  Service: Gynecology;  Laterality: N/A;   OVARIAN CYST SURGERY  1995   ROBOTIC ASSISTED TOTAL HYSTERECTOMY WITH BILATERAL SALPINGO OOPHERECTOMY Bilateral 06/12/2024   Procedure: HYSTERECTOMY, TOTAL, ROBOT-ASSISTED, LAPAROSCOPIC, WITH BILATERAL SALPINGO-OOPHORECTOMY;  Surgeon: Viktoria Comer SAUNDERS, MD;  Location: [facility];  Service: Gynecology;  Laterality: Bilateral;   TONSILLECTOMY     WISDOM TOOTH EXTRACTION      FAMILY HISTORY:  Family History  Problem Relation Age of Onset   Heart disease Mother    Diabetes Mother    Stroke Mother 68       CVA x 2   Hyperlipidemia Father    Stroke Maternal Grandmother    Mental retardation Maternal Grandmother    Cervical cancer Maternal Grandmother    Heart disease Maternal Grandfather    Cancer Paternal Grandmother    Cancer Paternal Grandfather    Heart disease Paternal Grandfather    Colon cancer Paternal Grandfather    Breast cancer Neg Hx    Prostate cancer Neg Hx    Ovarian cancer Neg Hx    Pancreatic cancer Neg Hx     SOCIAL HISTORY: Social History[1]  ALLERGIES: Allergies[2]  MEDICATIONS:  Current Outpatient Medications  Medication Sig Dispense Refill   ascorbic acid (VITAMIN C) 1000 MG tablet Take 1,000 mg by mouth daily.     BLACK ELDERBERRY,BERRY-FLOWER, PO Take 1 tablet by mouth in the morning and at bedtime.     Cholecalciferol 50 MCG (2000 UT) TABS Take 2,000 Units by mouth daily.     GLUCOSAMINE-CHONDROITIN PO Take 1 tablet by mouth in the morning and at bedtime.     levothyroxine  (SYNTHROID , LEVOTHROID) 100 MCG tablet Take 1 tablet (100 mcg total) by mouth daily before breakfast. 90 tablet 1   liothyronine  (CYTOMEL ) 25 MCG tablet Take 0.5 tablets (12.5 mcg total) by mouth daily. 90 tablet 1   losartan  (COZAAR ) 25 MG tablet Take 1 tablet (25 mg total) by mouth 2 (two) times daily. 180 tablet 1   Magnesium 200 MG TABS Take 200 mg by mouth daily.     NON  FORMULARY Take 1 Scoop by mouth daily. Vinia Red Grape powder     Omega-3 Fatty Acids (FISH OIL PO) Take 1,400 mg by mouth daily.     OVER THE COUNTER MEDICATION Take 1 tablet by mouth daily. Immuno 150     Probiotic Product (PROBIOTIC PO) Take 1 tablet by mouth daily.     senna-docusate (SENOKOT-S) 8.6-50 MG tablet Take 2 tablets by mouth at bedtime. For AFTER surgery, do not take if having diarrhea 30 tablet 0   traMADol  (ULTRAM ) 50 MG tablet Take 1 tablet (50 mg total) by mouth every 6 (six) hours as needed for moderate pain (pain score 4-6). For AFTER surgery only, do not take and drive (Patient not taking: Reported on 07/10/2024) 10 tablet 0   Vitamin D , Ergocalciferol , (DRISDOL) 50000 units CAPS capsule Take 50,000 Units by mouth every 7 (seven) days.     No current facility-administered medications for this encounter.    REVIEW OF SYSTEMS:  A 10+ POINT REVIEW OF SYSTEMS WAS OBTAINED including neurology, dermatology, psychiatry, cardiac, respiratory, lymph, extremities, GI, GU, musculoskeletal, constitutional, reproductive, HEENT. ***   PHYSICAL EXAM:  vitals were  not taken for this visit.   General: Alert and oriented, in no acute distress HEENT: Head is normocephalic. Extraocular movements are intact. Oropharynx is clear. Neck: Neck is supple, no palpable cervical or supraclavicular lymphadenopathy. Heart: Regular in rate and rhythm with no murmurs, rubs, or gallops. Chest: Clear to auscultation bilaterally, with no rhonchi, wheezes, or rales. Abdomen: Soft, nontender, nondistended, with no rigidity or guarding. Extremities: No cyanosis or edema. Lymphatics: see Neck Exam Skin: No concerning lesions. Musculoskeletal: symmetric strength and muscle tone throughout. Neurologic: Cranial nerves II through XII are grossly intact. No obvious focalities. Speech is fluent. Coordination is intact. Psychiatric: Judgment and insight are intact. Affect is appropriate. ***  ECOG = ***  0 -  Asymptomatic (Fully active, able to carry on all predisease activities without restriction)  1 - Symptomatic but completely ambulatory (Restricted in physically strenuous activity but ambulatory and able to carry out work of a light or sedentary nature. For example, light housework, office work)  2 - Symptomatic, <50% in bed during the day (Ambulatory and capable of all self care but unable to carry out any work activities. Up and about more than 50% of waking hours)  3 - Symptomatic, >50% in bed, but not bedbound (Capable of only limited self-care, confined to bed or chair 50% or more of waking hours)  4 - Bedbound (Completely disabled. Cannot carry on any self-care. Totally confined to bed or chair)  5 - Death   Raylene MM, Creech RH, Tormey DC, et al. [phone]). Toxicity and response criteria of the [facility]. Am. DOROTHA Bridges. Oncol. 5 (6): 649-55  LABORATORY DATA:  Lab Results  Component Value Date   WBC 11.7 (H) 06/13/2024   HGB 13.5 06/13/2024   HCT 38.6 06/13/2024   MCV 93.5 06/13/2024   PLT 242 06/13/2024   NEUTROABS 2.4 04/06/2018   Lab Results  Component Value Date   NA 138 06/13/2024   K 4.4 06/13/2024   CL 105 06/13/2024   CO2 24 06/13/2024   GLUCOSE 106 (H) 06/13/2024   BUN 9 06/13/2024   CREATININE 0.64 06/13/2024   CALCIUM 9.0 06/13/2024      RADIOGRAPHY: CT CHEST ABDOMEN PELVIS W CONTRAST Result Date: 07/11/2024 CLINICAL DATA:  Cervical and endometrial cancer staging, status post recent hysterectomy * Tracking Code: BO * EXAM: CT CHEST, ABDOMEN, AND PELVIS WITH CONTRAST TECHNIQUE: Multidetector CT imaging of the chest, abdomen and pelvis was performed following the standard protocol during bolus administration of intravenous contrast. RADIATION DOSE REDUCTION: This exam was performed according to the departmental dose-optimization program which includes automated exposure control, adjustment of the mA and/or kV according to patient size and/or  use of iterative reconstruction technique. CONTRAST:  OMNIPAQUE  IOHEXOL  300 MG/ML SOLN additional oral enteric contrast COMPARISON:  None Available. FINDINGS: CT CHEST FINDINGS Cardiovascular: No significant vascular findings. Normal heart size. Enlargement of the main pulmonary artery measuring up to 3.6 cm in caliber. No pericardial effusion. Mediastinum/Nodes: No enlarged mediastinal, hilar, or axillary lymph nodes. Thyroid  gland, trachea, and esophagus demonstrate no significant findings. Lungs/Pleura: Lungs are clear. No pleural effusion or pneumothorax. Musculoskeletal: No chest wall abnormality. No acute osseous findings. CT ABDOMEN PELVIS FINDINGS Hepatobiliary: No solid liver abnormality is seen. No gallstones, gallbladder wall thickening, or biliary dilatation. Pancreas: Unremarkable. No pancreatic ductal dilatation or surrounding inflammatory changes. Spleen: Normal in size without significant abnormality. Adrenals/Urinary Tract: Adrenal glands are unremarkable. Kidneys are normal, without renal calculi, solid lesion, or hydronephrosis. Bladder is unremarkable. Stomach/Bowel:  Stomach is within normal limits. Appendix appears normal. No evidence of bowel wall thickening, distention, or inflammatory changes. Vascular/Lymphatic: No significant vascular findings are present. No enlarged abdominal or pelvic lymph nodes. Reproductive: Hysterectomy and oophorectomy. Other: No abdominal wall hernia or abnormality. No ascites. Musculoskeletal: No acute osseous findings. IMPRESSION: 1. Status post hysterectomy and oophorectomy. 2. No evidence of lymphadenopathy or metastatic disease in the chest, abdomen, or pelvis. 3. Enlargement of the main pulmonary artery, as can be seen in pulmonary hypertension. Electronically Signed   By: Marolyn JONETTA Jaksch M.D.   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  We discussed the natural history of *** and general treatment, highlighting the role of radiotherapy in the management.  We discussed the available radiation techniques, and focused on the details of logistics and delivery.  We reviewed the anticipated acute and late sequelae associated with radiation in this setting.  The patient was encouraged to ask questions that I answered to the best of my ability. *** A patient consent form was discussed and signed.  We retained a copy for our records.  The patient would like to proceed with radiation and will be scheduled for CT simulation.  PLAN: ***    *** minutes of total time was spent for this patient encounter, including preparation, face-to-face counseling with the patient and coordination of care, physical exam, and documentation of the encounter.   ------------------------------------------------  Lynwood CHARM Nasuti, PhD, MD  This document serves as a record of services personally performed by Lynwood Nasuti, MD. It was created on his behalf by Dorthy Fuse, a trained medical scribe. The creation of this record is based on the scribe's personal observations and the provider's statements to them. This document has been checked and approved by the attending provider.     [1]  Social History Tobacco Use   Smoking status: Never   Smokeless tobacco: Never  Vaping Use   Vaping status: Never Used  Substance Use Topics   Alcohol use: Yes    Comment: rarely   Drug use: No  [2]  Allergies Allergen Reactions   Penicillins Swelling    Third grade, severe swelling, mainly in lips   Egg Protein-Containing Drug Products Other (See Comments)    Allergy test. Raw eggs    Wound Dressing Adhesive Dermatitis

## 2024-07-15 ENCOUNTER — Encounter: Payer: Self-pay | Admitting: Radiation Oncology

## 2024-07-15 ENCOUNTER — Ambulatory Visit
Admission: RE | Admit: 2024-07-15 | Discharge: 2024-07-15 | Attending: Radiation Oncology | Admitting: Radiation Oncology

## 2024-07-15 VITALS — BP 160/65 | HR 81 | Temp 96.9°F | Resp 18 | Ht 63.0 in | Wt 264.5 lb

## 2024-07-15 DIAGNOSIS — Z808 Family history of malignant neoplasm of other organs or systems: Secondary | ICD-10-CM | POA: Diagnosis not present

## 2024-07-15 DIAGNOSIS — E039 Hypothyroidism, unspecified: Secondary | ICD-10-CM | POA: Diagnosis not present

## 2024-07-15 DIAGNOSIS — J45909 Unspecified asthma, uncomplicated: Secondary | ICD-10-CM | POA: Diagnosis not present

## 2024-07-15 DIAGNOSIS — M1990 Unspecified osteoarthritis, unspecified site: Secondary | ICD-10-CM | POA: Diagnosis not present

## 2024-07-15 DIAGNOSIS — C541 Malignant neoplasm of endometrium: Secondary | ICD-10-CM | POA: Insufficient documentation

## 2024-07-15 DIAGNOSIS — Z79890 Hormone replacement therapy (postmenopausal): Secondary | ICD-10-CM | POA: Diagnosis not present

## 2024-07-15 DIAGNOSIS — R5383 Other fatigue: Secondary | ICD-10-CM | POA: Diagnosis not present

## 2024-07-15 DIAGNOSIS — Z79899 Other long term (current) drug therapy: Secondary | ICD-10-CM | POA: Diagnosis not present

## 2024-07-15 DIAGNOSIS — Z800 Family history of malignant neoplasm of digestive organs: Secondary | ICD-10-CM | POA: Diagnosis not present

## 2024-07-15 DIAGNOSIS — I10 Essential (primary) hypertension: Secondary | ICD-10-CM | POA: Diagnosis not present

## 2024-07-18 ENCOUNTER — Encounter: Payer: Self-pay | Admitting: Gynecologic Oncology

## 2024-07-27 ENCOUNTER — Other Ambulatory Visit: Payer: Self-pay | Admitting: Gynecologic Oncology

## 2024-07-27 DIAGNOSIS — N8502 Endometrial intraepithelial neoplasia [EIN]: Secondary | ICD-10-CM

## 2024-08-02 ENCOUNTER — Encounter: Payer: Self-pay | Admitting: Gynecologic Oncology

## 2024-08-19 ENCOUNTER — Encounter (HOSPITAL_BASED_OUTPATIENT_CLINIC_OR_DEPARTMENT_OTHER): Payer: Self-pay | Admitting: Radiology

## 2024-08-19 ENCOUNTER — Other Ambulatory Visit (HOSPITAL_BASED_OUTPATIENT_CLINIC_OR_DEPARTMENT_OTHER): Payer: Self-pay | Admitting: Family Medicine

## 2024-08-19 ENCOUNTER — Ambulatory Visit (HOSPITAL_BASED_OUTPATIENT_CLINIC_OR_DEPARTMENT_OTHER)
Admission: RE | Admit: 2024-08-19 | Discharge: 2024-08-19 | Disposition: A | Discharge status: Home / Self Care | Source: Ambulatory Visit | Attending: Family Medicine | Admitting: Family Medicine

## 2024-08-19 DIAGNOSIS — Z1231 Encounter for screening mammogram for malignant neoplasm of breast: Secondary | ICD-10-CM

## 2024-10-25 ENCOUNTER — Inpatient Hospital Stay: Admitting: Gynecologic Oncology

## 2025-01-10 ENCOUNTER — Inpatient Hospital Stay: Admitting: Gynecologic Oncology
